# Patient Record
Sex: Male | Born: 1970 | Race: White | Hispanic: No | Marital: Single | Smoking: Current every day smoker
Health system: Southern US, Community
[De-identification: ages and names within clinical notes are randomized; demographics above are authoritative.]

## PROBLEM LIST (undated history)

## (undated) DIAGNOSIS — G629 Polyneuropathy, unspecified: Secondary | ICD-10-CM

## (undated) DIAGNOSIS — E119 Type 2 diabetes mellitus without complications: Secondary | ICD-10-CM

## (undated) DIAGNOSIS — C187 Malignant neoplasm of sigmoid colon: Secondary | ICD-10-CM

## (undated) HISTORY — PX: COLOSTOMY: SHX63

## (undated) HISTORY — DX: Malignant neoplasm of sigmoid colon: C18.7

---
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
## 2009-12-04 ENCOUNTER — Emergency Department (HOSPITAL_COMMUNITY): Admission: EM | Admit: 2009-12-04 | Discharge: 2009-12-04 | Payer: Self-pay | Admitting: Emergency Medicine

## 2011-10-05 ENCOUNTER — Encounter (HOSPITAL_COMMUNITY): Payer: Self-pay | Admitting: *Deleted

## 2011-10-05 ENCOUNTER — Emergency Department (HOSPITAL_COMMUNITY)
Admission: EM | Admit: 2011-10-05 | Discharge: 2011-10-06 | Disposition: A | Discharge status: Home / Self Care | Payer: Self-pay | Attending: Emergency Medicine | Admitting: Emergency Medicine

## 2011-10-05 DIAGNOSIS — R21 Rash and other nonspecific skin eruption: Secondary | ICD-10-CM | POA: Insufficient documentation

## 2011-10-05 DIAGNOSIS — IMO0001 Reserved for inherently not codable concepts without codable children: Secondary | ICD-10-CM | POA: Insufficient documentation

## 2011-10-05 DIAGNOSIS — M2550 Pain in unspecified joint: Secondary | ICD-10-CM | POA: Insufficient documentation

## 2011-10-05 DIAGNOSIS — W57XXXA Bitten or stung by nonvenomous insect and other nonvenomous arthropods, initial encounter: Secondary | ICD-10-CM | POA: Insufficient documentation

## 2011-10-05 DIAGNOSIS — S30860A Insect bite (nonvenomous) of lower back and pelvis, initial encounter: Secondary | ICD-10-CM | POA: Insufficient documentation

## 2011-10-05 NOTE — ED Notes (Signed)
Pt reports he removed a tick from his back 3 days ago, now area red and swollen, pt also now c/o generalized  Body aches

## 2011-10-06 MED ORDER — DOXYCYCLINE HYCLATE 100 MG PO TABS: 100.0000 mg | ORAL_TABLET | Freq: Two times a day (BID) | ORAL | Status: AC

## 2011-10-06 MED ORDER — KETOROLAC TROMETHAMINE 10 MG PO TABS
10.0000 mg | ORAL_TABLET | Freq: Once | ORAL | Status: AC
  Administered 2011-10-06: 10 mg via ORAL
  Filled 2011-10-06: qty 1

## 2011-10-06 MED ORDER — ONDANSETRON HCL 4 MG PO TABS
4.0000 mg | ORAL_TABLET | Freq: Once | ORAL | Status: AC
  Administered 2011-10-06: 4 mg via ORAL
  Filled 2011-10-06: qty 1

## 2011-10-06 MED ORDER — DOXYCYCLINE HYCLATE 100 MG PO TABS
100.0000 mg | ORAL_TABLET | Freq: Once | ORAL | Status: AC
  Administered 2011-10-06: 100 mg via ORAL
  Filled 2011-10-06: qty 1

## 2011-10-06 NOTE — ED Notes (Addendum)
Pt reporting tick bite on Thursday night. Area on back, approximately size of a quarter.  States that since that time, he has had progressive muscle aches in lower back and arms.  Pt also reporting rash on lower legs.  States rash began today.  Small raised rash noted.  Pt showing no signs of distress at this time.

## 2011-10-06 NOTE — ED Provider Notes (Signed)
History     CSN: [id number]  Arrival date & time 10/05/11  2347   First MD Initiated Contact with Patient 10/06/11 0004      Chief Complaint  Patient presents with  . Wound Check    (Consider location/radiation/quality/duration/timing/severity/associated sxs/prior treatment) HPI Comments: Pt was out fishing when he ran into a night swarm of mosquitoes and was bit by a tick. 2 days later he noted a rash on the right leg and a sore raised area of the lower back. He removed a tick. No c/o fever. He c/o stiffness of the hands and shoulders. It is of note he has be fishing for "monster fish" that weigh an average of 40 lbs. He tried tylenol, but this has not help. Pt concerned about RMSF.   History reviewed. No pertinent past medical history.  History reviewed. No pertinent past surgical history.  No family history on file.  History  Substance Use Topics  . Smoking status: Current Everyday Smoker  . Smokeless tobacco: Not on file  . Alcohol Use: Yes      Review of Systems  Constitutional: Negative for activity change.       All ROS Neg except as noted in HPI  HENT: Negative for nosebleeds and neck pain.   Eyes: Negative for photophobia and discharge.  Respiratory: Negative for cough, shortness of breath and wheezing.   Cardiovascular: Negative for chest pain and palpitations.  Gastrointestinal: Negative for abdominal pain and blood in stool.  Genitourinary: Negative for dysuria, frequency and hematuria.  Musculoskeletal: Positive for myalgias and arthralgias. Negative for back pain.  Skin: Positive for rash.  Neurological: Negative for dizziness, seizures and speech difficulty.  Psychiatric/Behavioral: Negative for hallucinations and confusion.    Allergies  Review of patient's allergies indicates no known allergies.  Home Medications  No current outpatient prescriptions on file.  BP 145/82  Pulse 88  Temp(Src) 98 F (36.7 C) (Oral)  Resp 20  Ht 5\' 8"  (1.727 m)   Wt 290 lb (131.543 kg)  BMI 44.09 kg/m2  SpO2 98%  Physical Exam  Nursing note and vitals reviewed. Constitutional: He is oriented to person, place, and time. He appears well-developed and well-nourished.  Non-toxic appearance.  HENT:  Head: Normocephalic.  Right Ear: Tympanic membrane and external ear normal.  Left Ear: Tympanic membrane and external ear normal.  Eyes: EOM and lids are normal. Pupils are equal, round, and reactive to light.  Neck: Normal range of motion. Neck supple. Carotid bruit is not present.  Cardiovascular: Normal rate, regular rhythm, normal heart sounds, intact distal pulses and normal pulses.   Pulmonary/Chest: Breath sounds normal. No respiratory distress.  Abdominal: Soft. Bowel sounds are normal. There is no tenderness. There is no guarding.  Musculoskeletal:       Raised small red area at the site of a tick bite of the lower back. Pt c/o stiffness of the fingers and shoulders. Bite site examined with magnification. No insect body parts noted.  Lymphadenopathy:       Head (right side): No submandibular adenopathy present.       Head (left side): No submandibular adenopathy present.    He has no cervical adenopathy.  Neurological: He is alert and oriented to person, place, and time. He has normal strength. No cranial nerve deficit or sensory deficit.  Skin: Skin is warm and dry.       Pt has a papular rash of the right lower ext.   Psychiatric: He has a normal mood  and affect. His speech is normal.    ED Course  Procedures (including critical care time)  Labs Reviewed - No data to display No results found.   No diagnosis found.    MDM  I have reviewed nursing notes, vital signs, and all appropriate lab and imaging results for this patient. Rx for doxycycline give. Pt encouraged to use ibuprofen three times daily with food       Kathie Dike, [place] 10/06/11 0033  Kathie Dike, [place] 10/06/11 [phone]

## 2011-10-06 NOTE — Discharge Instructions (Signed)
Wood Tick Bite Ticks are insects that attach themselves to the skin. Most tick bites are harmless, but sometimes ticks carry diseases that can make a person quite ill. The chance of getting ill depends on:  The kind of tick that bites you.   Time of year.   How long the tick is attached.   Geographic location.  Wood ticks are also called dog ticks. They are generally black. They can have white markings. They live in shrubs and grassy areas. They are larger than deer ticks. Wood ticks are about the size of a watermelon seed. They have a hard body. The most common places for ticks to attach themselves are the scalp, neck, armpits, waist, and groin. Wood ticks may stay attached for up to 2 weeks. TICKS MUST BE REMOVED AS SOON AS POSSIBLE TO HELP PREVENT DISEASES CAUSED BY TICK BITES.  TO REMOVE A TICK: 1. If available, put on latex gloves before trying to remove a tick.  2. Grasp the tick as close to the skin as possible, with curved forceps, fine tweezers or a special tick removal tool.  3. Pull gently with steady pressure until the tick lets go. Do not twist the tick or jerk it suddenly. This may break off the tick's head or mouth parts.  4. Do not crush the tick's body. This could force disease-carrying fluids from the tick into your body.  5. After the tick is removed, wash the bite area and your hands with soap and water or other disinfectant.  6. Apply a small amount of antiseptic cream or ointment to the bite site.  7. Wash and disinfect any instruments that were used.  8. Save the tick in a jar or plastic bag for later identification. Preserve the tick with a bit of alcohol or put it in the freezer.  9. Do not apply a hot match, petroleum jelly, or fingernail polish to the tick. This does not work and may increase the chances of disease from the tick bite.  YOU MAY NEED TO SEE YOUR CAREGIVER FOR A TETANUS SHOT NOW IF:  You have no idea when you had the last one.   You have never had a  tetanus shot before.  If you need a tetanus shot, and you decide not to get one, there is a rare chance of getting tetanus. Sickness from tetanus can be serious. If you get a tetanus shot, your arm may swell, get red and warm to the touch at the shot site. This is common and not a problem. PREVENTION  Wear protective clothing. Long sleeves and pants are best.   Wear white clothes to see ticks more easily   Tuck your pant legs into your socks.   If walking on trail, stay in the middle of the trail to avoid brushing against bushes.   Put insect repellent on all exposed skin and along boot tops, pant legs and sleeve cuffs   Check clothing, hair and skin repeatedly and before coming inside.   Brush off any ticks that are not attached.  SEEK MEDICAL CARE IF:   You cannot remove a tick or part of the tick that is left in the skin.   Unexplained fever.   Redness and swelling in the area of the tick bite.   Tender, swollen lymph glands.   Diarrhea.   Weight loss.   Cough.   Fatigue.   Muscle, joint or bone pain.   Belly pain.   Headache.   Rash.    SEEK IMMEDIATE MEDICAL CARE IF:   You develop an oral temperature above 102 F (38.9 C).   You are having trouble walking or moving your legs.   Numbness in the legs.   Shortness of breath.   Confusion.   Repeated vomiting.  Document Released: 05/02/2000 Document Revised: 04/24/2011 Document Reviewed: 04/10/2008 [employer] Patient Information 2012 [employer]. 

## 2011-10-07 NOTE — ED Provider Notes (Signed)
Medical screening examination/treatment/procedure(s) were performed by non-physician practitioner and as supervising physician I was immediately available for consultation/collaboration.  Nicoletta Dress. Colon Branch, MD 10/07/11 [phone]

## 2021-01-01 DIAGNOSIS — K5792 Diverticulitis of intestine, part unspecified, without perforation or abscess without bleeding: Secondary | ICD-10-CM | POA: Insufficient documentation

## 2021-01-13 ENCOUNTER — Encounter (HOSPITAL_COMMUNITY): Payer: Self-pay

## 2021-01-13 ENCOUNTER — Emergency Department (HOSPITAL_COMMUNITY)
Admission: EM | Admit: 2021-01-13 | Discharge: 2021-01-13 | Disposition: A | Discharge status: Home / Self Care | Payer: Commercial Managed Care - PPO | Attending: Emergency Medicine | Admitting: Emergency Medicine

## 2021-01-13 ENCOUNTER — Emergency Department (HOSPITAL_COMMUNITY)
Admission: EM | Admit: 2021-01-13 | Discharge: 2021-01-13 | Payer: Commercial Managed Care - PPO | Attending: Emergency Medicine | Admitting: Emergency Medicine

## 2021-01-13 ENCOUNTER — Emergency Department (HOSPITAL_COMMUNITY): Payer: Commercial Managed Care - PPO

## 2021-01-13 ENCOUNTER — Other Ambulatory Visit: Payer: Self-pay

## 2021-01-13 DIAGNOSIS — A419 Sepsis, unspecified organism: Secondary | ICD-10-CM | POA: Insufficient documentation

## 2021-01-13 DIAGNOSIS — M79652 Pain in left thigh: Secondary | ICD-10-CM | POA: Diagnosis present

## 2021-01-13 DIAGNOSIS — F17200 Nicotine dependence, unspecified, uncomplicated: Secondary | ICD-10-CM | POA: Insufficient documentation

## 2021-01-13 DIAGNOSIS — Z5321 Procedure and treatment not carried out due to patient leaving prior to being seen by health care provider: Secondary | ICD-10-CM | POA: Insufficient documentation

## 2021-01-13 DIAGNOSIS — L02416 Cutaneous abscess of left lower limb: Secondary | ICD-10-CM | POA: Insufficient documentation

## 2021-01-13 DIAGNOSIS — L0291 Cutaneous abscess, unspecified: Secondary | ICD-10-CM

## 2021-01-13 LAB — CBC WITH DIFFERENTIAL/PLATELET
Abs Immature Granulocytes: 0.35 10*3/uL — ABNORMAL HIGH (ref 0.00–0.07)
Basophils Absolute: 0.1 10*3/uL (ref 0.0–0.1)
Basophils Relative: 0 %
Eosinophils Absolute: 0.2 10*3/uL (ref 0.0–0.5)
Eosinophils Relative: 1 %
HCT: 31.2 % — ABNORMAL LOW (ref 39.0–52.0)
Hemoglobin: 9.5 g/dL — ABNORMAL LOW (ref 13.0–17.0)
Immature Granulocytes: 2 %
Lymphocytes Relative: 12 %
Lymphs Abs: 2.8 10*3/uL (ref 0.7–4.0)
MCH: 24.5 pg — ABNORMAL LOW (ref 26.0–34.0)
MCHC: 30.4 g/dL (ref 30.0–36.0)
MCV: 80.4 fL (ref 80.0–100.0)
Monocytes Absolute: 1.3 10*3/uL — ABNORMAL HIGH (ref 0.1–1.0)
Monocytes Relative: 6 %
Neutro Abs: 19 10*3/uL — ABNORMAL HIGH (ref 1.7–7.7)
Neutrophils Relative %: 79 %
Platelets: 739 10*3/uL — ABNORMAL HIGH (ref 150–400)
RBC: 3.88 MIL/uL — ABNORMAL LOW (ref 4.22–5.81)
RDW: 17.6 % — ABNORMAL HIGH (ref 11.5–15.5)
WBC: 23.6 10*3/uL — ABNORMAL HIGH (ref 4.0–10.5)
nRBC: 0 % (ref 0.0–0.2)

## 2021-01-13 LAB — BASIC METABOLIC PANEL
Anion gap: 10 (ref 5–15)
BUN: 10 mg/dL (ref 6–20)
CO2: 30 mmol/L (ref 22–32)
Calcium: 9.1 mg/dL (ref 8.9–10.3)
Chloride: 91 mmol/L — ABNORMAL LOW (ref 98–111)
Creatinine, Ser: 0.55 mg/dL — ABNORMAL LOW (ref 0.61–1.24)
GFR, Estimated: >60 mL/min (ref >60)
Glucose, Bld: 122 mg/dL — ABNORMAL HIGH (ref 70–99)
Potassium: 4.7 mmol/L (ref 3.5–5.1)
Sodium: 131 mmol/L — ABNORMAL LOW (ref 135–145)

## 2021-01-13 LAB — COMPREHENSIVE METABOLIC PANEL
ALT: 27 U/L (ref 0–44)
AST: 32 U/L (ref 15–41)
Albumin: 2.3 g/dL — ABNORMAL LOW (ref 3.5–5.0)
Alkaline Phosphatase: 160 U/L — ABNORMAL HIGH (ref 38–126)
Anion gap: 9 (ref 5–15)
BUN: 9 mg/dL (ref 6–20)
CO2: 28 mmol/L (ref 22–32)
Calcium: 8.7 mg/dL — ABNORMAL LOW (ref 8.9–10.3)
Chloride: 93 mmol/L — ABNORMAL LOW (ref 98–111)
Creatinine, Ser: 0.61 mg/dL (ref 0.61–1.24)
GFR, Estimated: >60 mL/min (ref >60)
Glucose, Bld: 200 mg/dL — ABNORMAL HIGH (ref 70–99)
Potassium: 4.2 mmol/L (ref 3.5–5.1)
Sodium: 130 mmol/L — ABNORMAL LOW (ref 135–145)
Total Bilirubin: 0.5 mg/dL (ref 0.3–1.2)
Total Protein: 6 g/dL — ABNORMAL LOW (ref 6.5–8.1)

## 2021-01-13 LAB — BLOOD GAS, VENOUS
Acid-Base Excess: 4.8 mmol/L — ABNORMAL HIGH (ref 0.0–2.0)
Bicarbonate: 28.4 mmol/L — ABNORMAL HIGH (ref 20.0–28.0)
FIO2: 21
O2 Saturation: 75.5 %
Patient temperature: 37.7
pCO2, Ven: 43.3 mmHg — ABNORMAL LOW (ref 44.0–60.0)
pH, Ven: 7.441 — ABNORMAL HIGH (ref 7.250–7.430)
pO2, Ven: 44.4 mmHg (ref 32.0–45.0)

## 2021-01-13 LAB — LACTIC ACID, PLASMA: Lactic Acid, Venous: 0.8 mmol/L (ref 0.5–1.9)

## 2021-01-13 LAB — CBC
HCT: 29.7 % — ABNORMAL LOW (ref 39.0–52.0)
Hemoglobin: 9.2 g/dL — ABNORMAL LOW (ref 13.0–17.0)
MCH: 24.5 pg — ABNORMAL LOW (ref 26.0–34.0)
MCHC: 31 g/dL (ref 30.0–36.0)
MCV: 79.2 fL — ABNORMAL LOW (ref 80.0–100.0)
Platelets: 703 10*3/uL — ABNORMAL HIGH (ref 150–400)
RBC: 3.75 MIL/uL — ABNORMAL LOW (ref 4.22–5.81)
RDW: 17.7 % — ABNORMAL HIGH (ref 11.5–15.5)
WBC: 22.2 10*3/uL — ABNORMAL HIGH (ref 4.0–10.5)
nRBC: 0 % (ref 0.0–0.2)

## 2021-01-13 MED ORDER — IOHEXOL 350 MG/ML SOLN
100.0000 mL | Freq: Once | INTRAVENOUS | Status: AC | PRN
  Administered 2021-01-13: 75 mL via INTRAVENOUS

## 2021-01-13 MED ORDER — VANCOMYCIN HCL 1250 MG/250ML IV SOLN: 1250.0000 mg | Freq: Three times a day (TID) | INTRAVENOUS | Status: DC

## 2021-01-13 MED ORDER — SODIUM CHLORIDE 0.9 % IV BOLUS
1000.0000 mL | Freq: Once | INTRAVENOUS | Status: AC
  Administered 2021-01-13: 1000 mL via INTRAVENOUS

## 2021-01-13 MED ORDER — HYDROCODONE-ACETAMINOPHEN 5-325 MG PO TABS
1.0000 | ORAL_TABLET | Freq: Once | ORAL | Status: AC
  Administered 2021-01-13: 1 via ORAL
  Filled 2021-01-13: qty 1

## 2021-01-13 MED ORDER — VANCOMYCIN HCL 2000 MG/400ML IV SOLN
2000.0000 mg | Freq: Once | INTRAVENOUS | Status: AC
  Administered 2021-01-13: 2000 mg via INTRAVENOUS
  Filled 2021-01-13: qty 400

## 2021-01-13 MED ORDER — PIPERACILLIN-TAZOBACTAM 3.375 G IVPB 30 MIN
3.3750 g | Freq: Once | INTRAVENOUS | Status: AC
  Administered 2021-01-13: 3.375 g via INTRAVENOUS
  Filled 2021-01-13: qty 50

## 2021-01-13 NOTE — Progress Notes (Signed)
Pharmacy Antibiotic Note  Jose Melendez a 50 y.o. male admitted on 01/13/2021 with  thigh abscess .  Pharmacy has been consulted for vancomycin dosing.  Plan: Vancomycin '1250mg'$  IV every 8 hours.  Goal trough 15-20 mcg/mL.  Medical History: History reviewed. No pertinent past medical history.  Allergies:  No Known Allergies  Filed Weights   01/13/21 1110  Weight: 121.6 kg (268 lb)    CBC Latest Ref Rng & Units 01/13/2021 01/13/2021  WBC 4.0 - 10.5 K/uL 23.6(H) 22.2(H)  Hemoglobin 13.0 - 17.0 g/dL 9.5(L) 9.2(L)  Hematocrit 39.0 - 52.0 % 31.2(L) 29.7(L)  Platelets 150 - 400 K/uL 739(H) 703(H)     Estimated Creatinine Clearance: 142.6 mL/min (A) (by C-G formula based on SCr of 0.55 mg/dL (L)).  Antibiotics Given (last 72 hours)     None       Antimicrobials this admission:   vancomycin 01/13/2021 >>   Microbiology results: 01/13/2021 BCx: sent   Thank you for allowing pharmacy to be a part of this patient's care.  Thomasenia Sales, PharmD Clinical Pharmacist

## 2021-01-13 NOTE — ED Notes (Signed)
Colotomy intact with brown soft stool noted.  Incision to mid abdomen with staples are dry and intact.  No redness or drainage noted.

## 2021-01-13 NOTE — ED Notes (Signed)
Pt decided to leave 

## 2021-01-13 NOTE — ED Provider Notes (Addendum)
[facility] Provider Note   CSN: [id number] Arrival date & time: 01/13/21  1025     History No chief complaint on file.   Jose Melendez is a 50 y.o. male.  Patient is a 50 yo male presenting for leg pain. 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 Aldona Lento on 8/17 at [facility]. States since discharge he has had progress left proximal anterior leg pain, non radiating, associated with swelling, stiffness, and inability to sleep do to discomfort. Denies fevers or chills.   The history is provided by the patient. No language interpreter was used.  Leg Pain Location:  Leg Leg location:  L leg Pain details:    Quality:  Aching   Radiates to:  Does not radiate Associated symptoms: no back pain and no fever       History reviewed. No pertinent past medical history.  There are no problems to display for this patient.   No past surgical history on file.     No family history on file.  Social History   Tobacco Use   Smoking status: Every Day  Substance Use Topics   Alcohol use: Yes    Home Medications Prior to Admission medications   Not on File    Allergies    Patient has no known allergies.  Review of Systems   Review of Systems  Constitutional:  Negative for chills and fever.  HENT:  Negative for ear pain and sore throat.   Eyes:  Negative for pain and visual disturbance.  Respiratory:  Negative for cough and shortness of breath.   Cardiovascular:  Negative for chest pain and palpitations.  Gastrointestinal:  Negative for abdominal pain and vomiting.  Genitourinary:  Negative for dysuria and hematuria.  Musculoskeletal:  Negative for arthralgias and back pain.       Left leg pain  Skin:  Negative for color change and rash.  Neurological:  Negative for seizures and syncope.  All other systems reviewed and are  negative.  Physical Exam Updated Vital Signs BP 120/76   Pulse 100   Temp 98.8 F (37.1 C) (Oral)   Resp 16   Ht '5\' 7"'$  (1.702 m)   Wt 121.6 kg   SpO2 99%   BMI 41.97 kg/m   Physical Exam Vitals and nursing note reviewed.  Constitutional:      Appearance: He is well-developed.  HENT:     Head: Normocephalic and atraumatic.  Eyes:     Conjunctiva/sclera: Conjunctivae normal.  Cardiovascular:     Rate and Rhythm: Normal rate and regular rhythm.     Heart sounds: No murmur heard. Pulmonary:     Effort: Pulmonary effort is normal. No respiratory distress.     Breath sounds: Normal breath sounds.  Abdominal:     Palpations: Abdomen is soft.     Tenderness: There is no abdominal tenderness.  Musculoskeletal:     Cervical back: Neck supple.       Legs:  Skin:    General: Skin is warm and dry.  Neurological:     Mental Status: He is alert.      ED Results / Procedures / Treatments   Labs (all labs ordered are listed, but only abnormal results are displayed) Labs Reviewed  CBC WITH DIFFERENTIAL/PLATELET - Abnormal; Notable for the following components:      Result Value  WBC 23.6 (*)    RBC 3.88 (*)    Hemoglobin 9.5 (*)    HCT 31.2 (*)    MCH 24.5 (*)    RDW 17.6 (*)    Platelets 739 (*)    Neutro Abs 19.0 (*)    Monocytes Absolute 1.3 (*)    Abs Immature Granulocytes 0.35 (*)    All other components within normal limits  BASIC METABOLIC PANEL - Abnormal; Notable for the following components:   Sodium 131 (*)    Chloride 91 (*)    Glucose, Bld 122 (*)    Creatinine, Ser 0.55 (*)    All other components within normal limits  CULTURE, BLOOD (ROUTINE X 2)  CULTURE, BLOOD (ROUTINE X 2)  LACTIC ACID, PLASMA  LACTIC ACID, PLASMA  BLOOD GAS, VENOUS    EKG None  Radiology CT EXTREMITY LOWER LEFT W CONTRAST  Result Date: 01/13/2021 CLINICAL DATA:  Worsening left thigh pain. Recent perforated sigmoid diverticulitis complicated by pelvic abscess extending  into the left leg. EXAM: CT OF THE LOWER LEFT EXTREMITY WITH CONTRAST TECHNIQUE: Multidetector CT imaging of the lower left extremity was performed according to the standard protocol following intravenous contrast administration. CONTRAST:  23m OMNIPAQUE IOHEXOL 350 MG/ML SOLN COMPARISON:  MRI left hip and femur dated January 01, 2021. FINDINGS: Bones/Joint/Cartilage No fracture or dislocation. Mild left hip osteoarthritis. No joint effusion. Ligaments Ligaments are suboptimally evaluated by CT. Muscles and Tendons Interval enlargement of the multiloculated thick-walled, rim enhancing fluid collection extending from the left iliopsoas muscle to the mid thigh within the anterior muscle compartment. This measures up to 6.3 x 7.9 x 30.2 cm (series 5, image 88; series 8, image 36). Soft tissue Moderate lateral hip soft tissue swelling. Mild diffuse soft tissue swelling of the left thigh. Reactive left inguinal lymphadenopathy. No soft tissue mass. Interval colectomy with Hartmann's pouch. IMPRESSION: 1. Interval enlargement of the multiloculated abscess extending from the left iliopsoas muscle to the mid thigh within the anterior muscle compartment, measuring over 30 cm in length. 2. No acute osseous abnormality. Electronically Signed   By: WTitus DubinM.D.   On: 01/13/2021 15:27    Procedures .Critical Care  Date/Time: 01/13/2021 4:00 PM Performed by: GLianne Cure DO Authorized by: GLianne Cure DO   Critical care provider statement:    Critical care time (minutes):  42   Critical care was necessary to treat or prevent imminent or life-threatening deterioration of the following conditions: loculated abscess in iliopsoas with sepsis.   Critical care was time spent personally by me on the following activities:  Ordering and performing treatments and interventions, ordering and review of laboratory studies, ordering and review of radiographic studies, re-evaluation of patient's condition, review of old  charts, examination of patient, evaluation of patient's response to treatment and discussions with consultants   I assumed direction of critical care for this patient from another provider in my specialty: yes     Care [facility] with comment[facility]     [facility] for continuity of care   Medications Ordered in ED Medications  HYDROcodone-acetaminophen (NORCO/VICODIN) 5-325 MG per tablet 1 tablet (1 tablet Oral Given 01/13/21 1320)  iohexol (OMNIPAQUE) 350 MG/ML injection 100 mL (75 mLs Intravenous Contrast Given 01/13/21 1451)  sodium chloride 0.9 % bolus 1,000 mL (1,000 mLs Intravenous New Bag/Given 01/13/21 1549)    ED Course  I have reviewed the triage vital signs and  the nursing notes.  Pertinent labs & imaging results that were available during my care of the patient were reviewed by me and considered in my medical decision making (see chart for details).  Clinical Course as of 01/14/21 1314  Sun Jan 13, 2021  1624 5c bed 3 5th floor [place] [phone] [LJ]    Clinical Course User Index [LJ] Rayna Sexton, PA-C   MDM Rules/Calculators/A&P                           1:13 PM  50 yo male presenting for leg pain. 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 Aldona Lento on 8/17.  Physical exam demonstrates well approximated vertical incision on left anterior proximal thigh. No swelling, wound dehiscence, erythema, or drainage. Tenderness to palpation proximal and lateral to incision with induration.  Signs/Symptoms of sepsis. Blood clutures, VBG, and LA ordered. IV fluids and vancomycin started for recurrent abscess post op day 11 with s/s sepsis.   CT lower extremity demonstrates: 1. Interval enlargement of the multiloculated abscess extending from the left iliopsoas muscle to the mid  thigh within the anterior muscle compartment, measuring over 30 cm in length. 2. No acute osseous abnormality.  I spoke with general surgeon on call at [facility] Dr. Christian Mate who agrees that patient would be best for transfer for continuity of care. I spoke with general surgeon Dr. Marissa Calamity at [facility] who agrees to accept patient to [facility] for admit.     Final Clinical Impression(s) / ED Diagnoses Final diagnoses:  Abscess thigh  Sepsis, due to unspecified organism, unspecified whether acute organ dysfunction present [facility])    Rx / DC Orders ED Discharge Orders     None        Lianne Cure, DO [id number]    Lianne Cure, DO [id number]    Lianne Cure, DO [id number]

## 2021-01-13 NOTE — ED Notes (Signed)
Refused 2nd blood culture.

## 2021-01-13 NOTE — ED Notes (Signed)
Dr Pearline Cables aware that pt refused 2nd BC.

## 2021-01-13 NOTE — ED Triage Notes (Signed)
Pt has pain in his leg, left leg pain.  Pt had sx on 2 weeks ago for stomach pain and now has pain in his leg.

## 2021-01-13 NOTE — ED Triage Notes (Signed)
Patient had recent abdominal surgery for colon problem with colostomy. Patient now complains of left thigh pain, denies trauma. Pain worse with ambulation

## 2021-01-18 LAB — CULTURE, BLOOD (ROUTINE X 2)
Culture: NO GROWTH
Special Requests: ADEQUATE

## 2021-01-30 DIAGNOSIS — C187 Malignant neoplasm of sigmoid colon: Secondary | ICD-10-CM | POA: Insufficient documentation

## 2021-04-03 DIAGNOSIS — Z95828 Presence of other vascular implants and grafts: Secondary | ICD-10-CM | POA: Insufficient documentation

## 2021-04-23 DIAGNOSIS — C801 Malignant (primary) neoplasm, unspecified: Secondary | ICD-10-CM | POA: Insufficient documentation

## 2022-02-28 ENCOUNTER — Telehealth (HOSPITAL_COMMUNITY): Payer: Self-pay

## 2022-02-28 NOTE — Telephone Encounter (Signed)
Called pt to schedule appt with Dr Nehemiah Settle no answer vm full

## 2022-03-23 IMAGING — CT CT EXTREM LOW W/ CM*L*
3 series · 9 of 33 positions shown, 10 images · IV contrast (omnipaque)
Comparison: MRI left hip and femur dated January 01, 2021.

CLINICAL DATA: Worsening left thigh pain. Recent perforated sigmoid
diverticulitis complicated by pelvic abscess extending into the left
leg.

EXAM:
CT OF THE LOWER LEFT EXTREMITY WITH CONTRAST
TECHNIQUE: Multidetector CT imaging of the lower left extremity was performed
according to the standard protocol following intravenous contrast
administration.
CONTRAST:  75mL OMNIPAQUE IOHEXOL 350 MG/ML SOLN

[Series 5: axial st · axial · 0.44mm/px · z∈[+830,+830]mm · 1 of 281 slices shown, 2 images]
[im 151/281  soft-tissue]
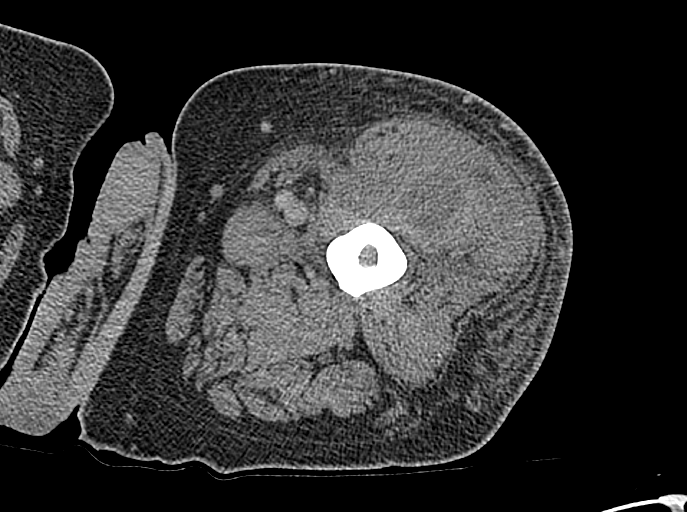
[im 151/281  bone]
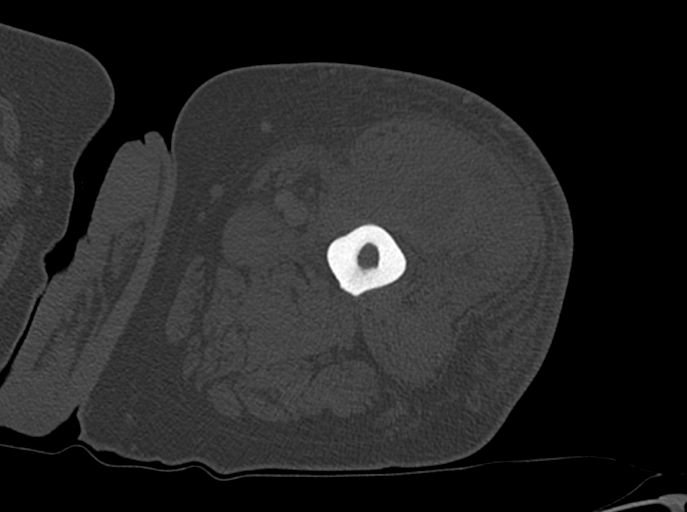

[Series 8: cor st · coronal · 0.50mm/px · 3 of 121 slices shown]
[im 25/121  bone]
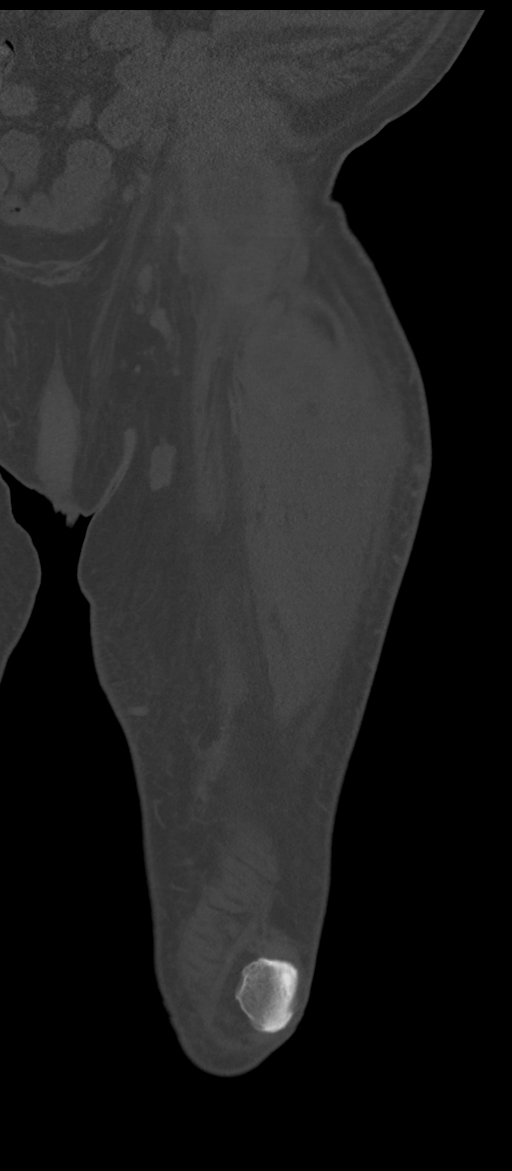
[im 49/121  bone]
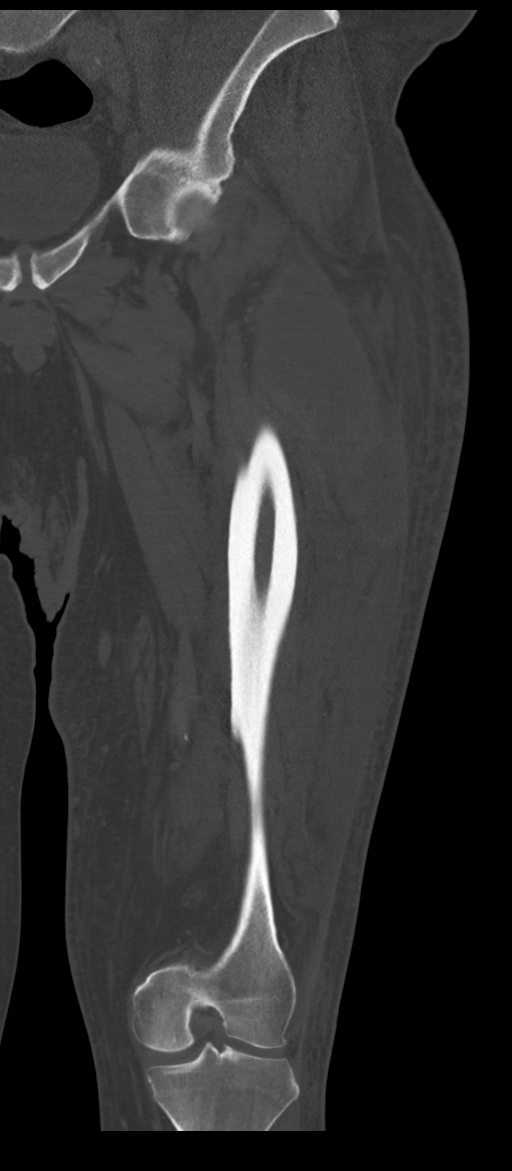
[im 73/121  bone]
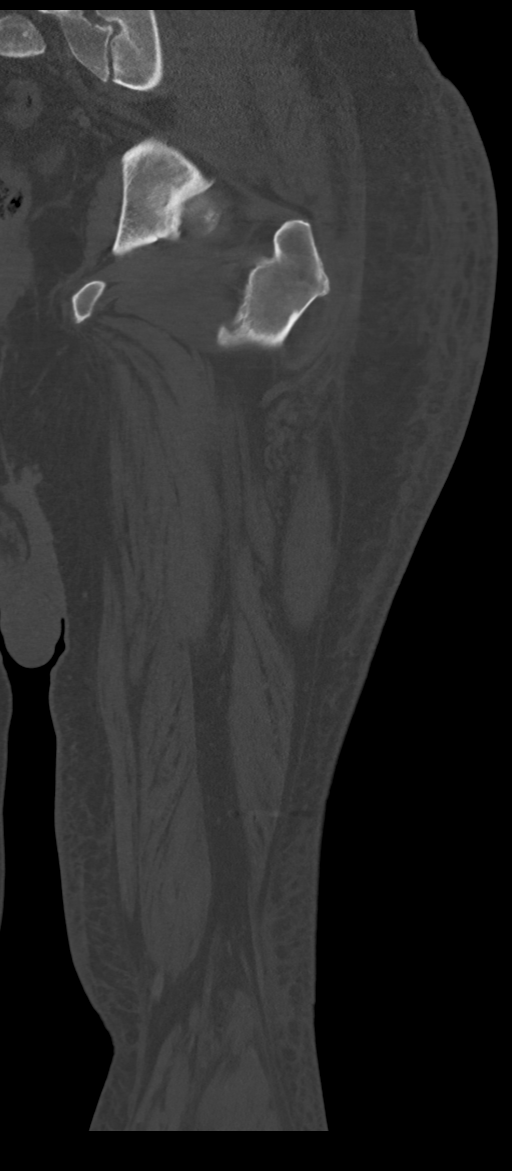

[Series 9: sag st · sagittal · 0.53mm/px · 5 of 124 slices shown]
[im 42/124  bone]
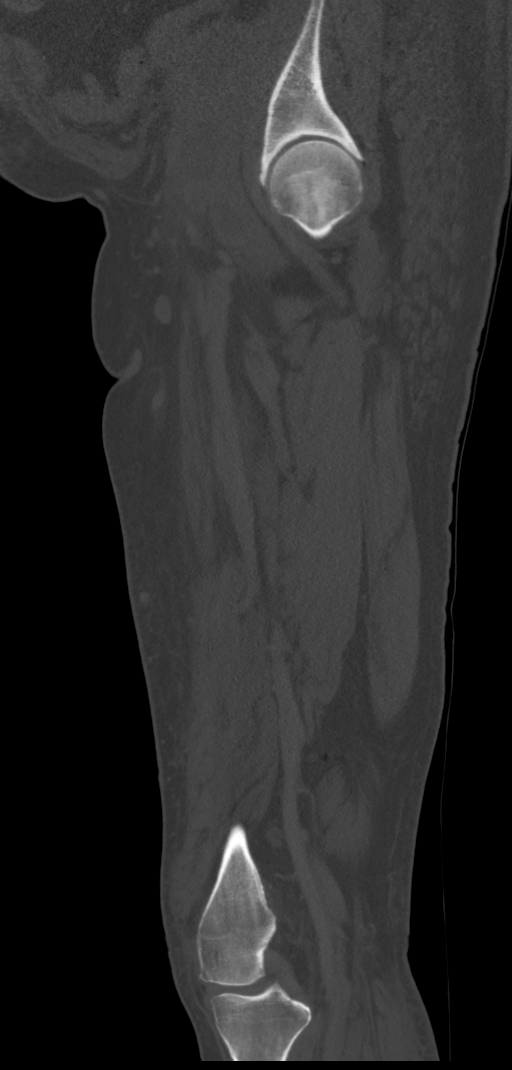
[im 52/124  bone]
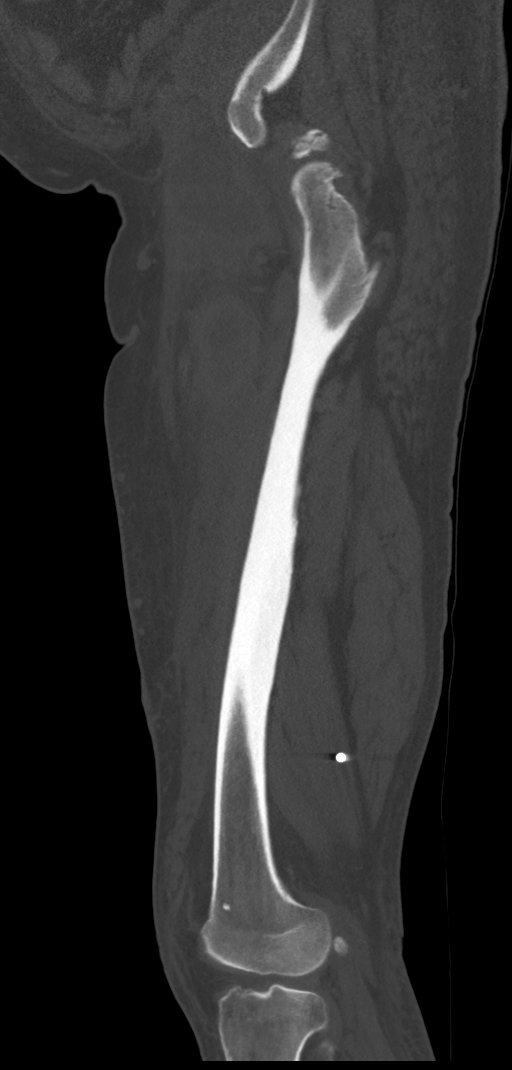
[im 62/124  bone]
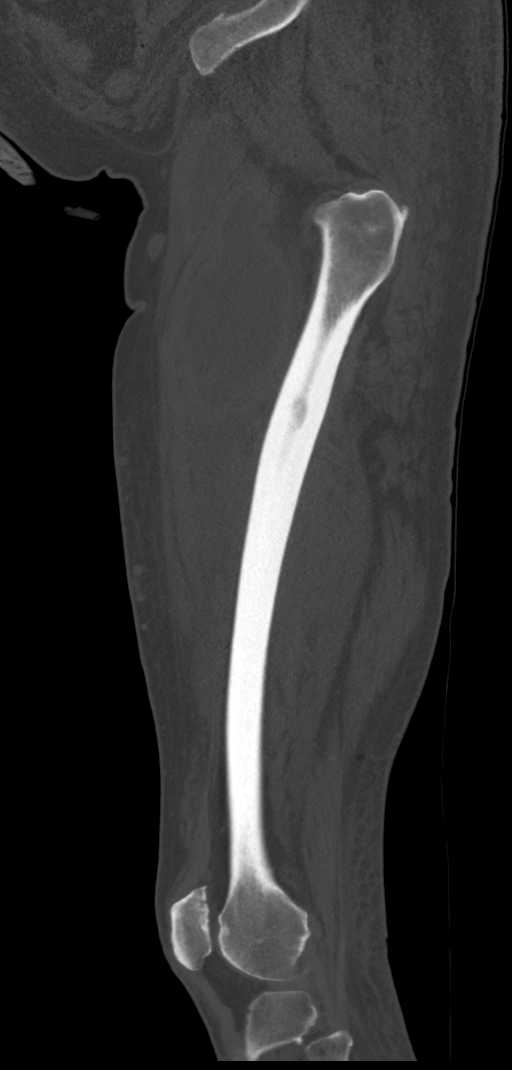
[im 72/124  bone]
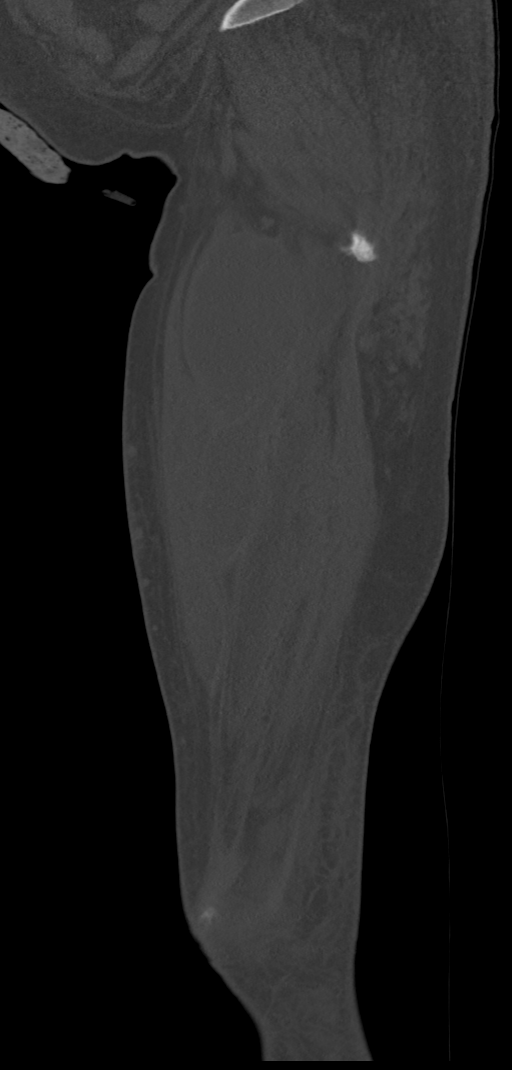
[im 83/124  bone]
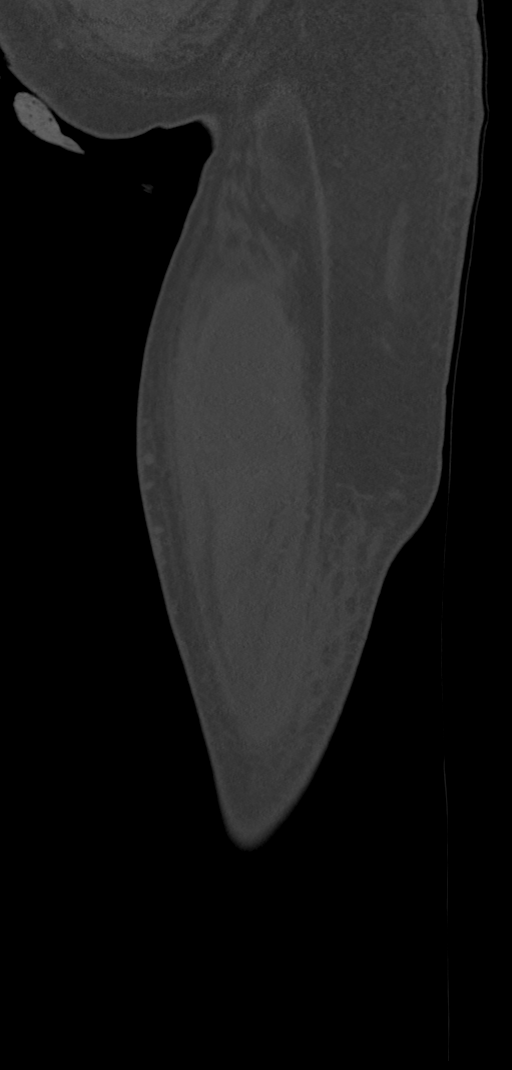

[9 of 33 positions shown; findings below may reference images not displayed]

FINDINGS: Bones/Joint/Cartilage

No fracture or dislocation. Mild left hip osteoarthritis. No joint
effusion.

Ligaments

Ligaments are suboptimally evaluated by CT.

Muscles and Tendons
Interval enlargement of the multiloculated thick-walled, rim
enhancing fluid collection extending from the left iliopsoas muscle
to the mid thigh within the anterior muscle compartment. This
measures up to 6.3 x 7.9 x 30.2 cm (series 5, image 88; series 8,
image 36).

Soft tissue
Moderate lateral hip soft tissue swelling. Mild diffuse soft tissue
swelling of the left thigh. Reactive left inguinal lymphadenopathy.
No soft tissue mass.

Interval colectomy with Hartmann's pouch.
IMPRESSION: 1. Interval enlargement of the multiloculated abscess extending from
the left iliopsoas muscle to the mid thigh within the anterior
muscle compartment, measuring over 30 cm in length.
2. No acute osseous abnormality.

## 2022-06-03 ENCOUNTER — Inpatient Hospital Stay: Payer: 59 | Attending: Hematology | Admitting: Hematology

## 2022-06-03 ENCOUNTER — Encounter: Payer: Self-pay | Admitting: Hematology

## 2022-06-03 ENCOUNTER — Other Ambulatory Visit: Payer: Self-pay

## 2022-06-03 ENCOUNTER — Inpatient Hospital Stay: Payer: 59

## 2022-06-03 VITALS — BP 144/70 | HR 88 | Temp 97.8°F | Resp 18 | Ht 68.0 in | Wt 328.4 lb

## 2022-06-03 DIAGNOSIS — R918 Other nonspecific abnormal finding of lung field: Secondary | ICD-10-CM | POA: Insufficient documentation

## 2022-06-03 DIAGNOSIS — Z79899 Other long term (current) drug therapy: Secondary | ICD-10-CM | POA: Diagnosis not present

## 2022-06-03 DIAGNOSIS — T451X5A Adverse effect of antineoplastic and immunosuppressive drugs, initial encounter: Secondary | ICD-10-CM | POA: Diagnosis not present

## 2022-06-03 DIAGNOSIS — Z95828 Presence of other vascular implants and grafts: Secondary | ICD-10-CM

## 2022-06-03 DIAGNOSIS — C187 Malignant neoplasm of sigmoid colon: Secondary | ICD-10-CM | POA: Insufficient documentation

## 2022-06-03 DIAGNOSIS — Z452 Encounter for adjustment and management of vascular access device: Secondary | ICD-10-CM | POA: Insufficient documentation

## 2022-06-03 DIAGNOSIS — F17210 Nicotine dependence, cigarettes, uncomplicated: Secondary | ICD-10-CM | POA: Diagnosis not present

## 2022-06-03 DIAGNOSIS — G620 Drug-induced polyneuropathy: Secondary | ICD-10-CM | POA: Insufficient documentation

## 2022-06-03 DIAGNOSIS — R69 Illness, unspecified: Secondary | ICD-10-CM | POA: Diagnosis not present

## 2022-06-03 DIAGNOSIS — F32A Depression, unspecified: Secondary | ICD-10-CM | POA: Diagnosis not present

## 2022-06-03 MED ORDER — OXYCODONE-ACETAMINOPHEN 7.5-325 MG PO TABS: 1.0000 | ORAL_TABLET | Freq: Three times a day (TID) | ORAL | 0 refills | Status: DC | PRN

## 2022-06-03 MED ORDER — HEPARIN SOD (PORK) LOCK FLUSH 100 UNIT/ML IV SOLN
500.0000 [IU] | Freq: Once | INTRAVENOUS | Status: AC
  Administered 2022-06-03: 500 [IU] via INTRAVENOUS

## 2022-06-03 MED ORDER — HEPARIN SOD (PORK) LOCK FLUSH 100 UNIT/ML IV SOLN: 500.0000 [IU] | Freq: Once | INTRAVENOUS | Status: AC

## 2022-06-03 MED ORDER — DULOXETINE HCL 30 MG PO CPEP: 60.0000 mg | ORAL_CAPSULE | Freq: Every day | ORAL | 3 refills | Status: DC

## 2022-06-03 MED ORDER — SODIUM CHLORIDE 0.9% FLUSH
10.0000 mL | Freq: Once | INTRAVENOUS | Status: AC
  Administered 2022-06-03: 10 mL via INTRAVENOUS

## 2022-06-03 NOTE — Patient Instructions (Addendum)
Jose Melendez  Discharge Instructions  You were seen and examined today by Dr. Delton Coombes. Dr. Delton Coombes is a medical oncologist, meaning that he specializes in the treatment of cancer diagnoses. Dr. Delton Coombes discussed your past medical history, family history of cancers, and the events that led to you being here today.  You were referred to Dr. Delton Coombes for ongoing care of your colon cancer.  Dr. Delton Coombes has recommended continued surveillance. You will have a CT scan and labs done next in 3 months.  Follow-up as scheduled.  Thank you for choosing [facility] to provide your oncology and hematology care.   To afford each patient quality time with our provider, please arrive at least 15 minutes before your scheduled appointment time. You may need to reschedule your appointment if you arrive late (10 or more minutes). Arriving late affects you and other patients whose appointments are after yours.  Also, if you miss three or more appointments without notifying the office, you may be dismissed from the clinic at the provider's discretion.    Again, thank you for choosing [facility].  Our hope is that these requests will decrease the amount of time that you wait before being seen by our physicians.   If you have a lab appointment with the [facility] please come in thru the Main Entrance and check in at the main information desk.           _____________________________________________________________  Should you have questions after your visit to [facility], please contact our office at [phone] and follow the prompts.  Our office hours are 8:00 a.m. to 4:30 p.m. Monday - Thursday and 8:00 a.m. to 2:30 p.m. Friday.  Please note that voicemails left after 4:00 p.m. may not be returned until the following business day.  We are closed weekends and all major holidays.  You do have access to a nurse 24-7,  just call the main number to the clinic [phone] and do not press any options, hold on the line and a nurse will answer the phone.    For prescription refill requests, have your pharmacy contact our office and allow 72 hours.    Masks are optional in the cancer centers. If you would like for your care team to wear a mask while they are taking care of you, please let them know. You may have one support person who is at least 50 years old accompany you for your appointments.

## 2022-06-03 NOTE — Progress Notes (Signed)
[facility] NOTE  Patient Care Team: Sasser, Silvestre Moment, MD as PCP - General (Cardiology) Derek Jack, MD as Medical Oncologist (Medical Oncology)  CHIEF COMPLAINTS/PURPOSE OF CONSULTATION:  Stage II sigmoid colon cancer.  HISTORY OF PRESENTING ILLNESS:  Jose Melendez 52 y.o. male is seen in consultation today at the request of [facility] for follow-up of for sigmoid colon cancer.  He was diagnosed with stage II sigmoid colon cancer when he presented with perforation in September 2022 and underwent resection.  He was initially seen at [facility] and was started on [place] ox regimen for 2 cycles.  Patient later transferred his care to [facility] to be close to home.  He has completed total of 7 cycles of capecitabine and oxaliplatin.  He could not tolerate 8 cycle because of neuropathy and pains.  He is currently not working.  He is taking oxycodone and gabapentin for neuropathic pains.  He lives at home with his wife and is independent of ADLs and IADLs.  MEDICAL HISTORY:  History reviewed. No pertinent past medical history.  SURGICAL HISTORY: History reviewed. No pertinent surgical history.  SOCIAL HISTORY: Social History   Socioeconomic History   Marital status: Single    Spouse name: Not on file   Number of children: Not on file   Years of education: Not on file   Highest education level: Not on file  Occupational History   Not on file  Tobacco Use   Smoking status: Every Day    Types: Cigarettes   Smokeless tobacco: Not on file  Substance and Sexual Activity   Alcohol use: Not Currently   Drug use: Never   Sexual activity: Not on file  Other Topics Concern   Not on file  Social History Narrative   Not on file   Social Determinants of Health   Financial Resource Strain: Not on file  Food Insecurity: No Food Insecurity (06/03/2022)   Hunger Vital Sign    Worried About Running Out of Food in the Last Year: Never true     Ran Out of Food in the Last Year: Never true  Transportation Needs: No Transportation Needs (06/03/2022)   PRAPARE - Hydrologist (Medical): No    Lack of Transportation (Non-Medical): No  Physical Activity: Not on file  Stress: Not on file  Social Connections: Not on file  Intimate Partner Violence: Not At Risk (06/03/2022)   Humiliation, Afraid, Rape, and Kick questionnaire    Fear of Current or Ex-Partner: No    Emotionally Abused: No    Physically Abused: No    Sexually Abused: No    FAMILY HISTORY: History reviewed. No pertinent family history.  ALLERGIES:  has No Known Allergies.  MEDICATIONS:  Current Outpatient Medications  Medication Sig Dispense Refill   buPROPion (WELLBUTRIN SR) 150 MG 12 hr tablet Take by mouth.     DULoxetine (CYMBALTA) 30 MG capsule Take 2 capsules (60 mg total) by mouth daily. Take '30mg'$  (1 capsule) daily for 7 days, then increase to '60mg'$  daily 60 capsule 3   ferrous sulfate 325 (65 FE) MG EC tablet Take by mouth.     gabapentin (NEURONTIN) 300 MG capsule Take by mouth.     melatonin 3 MG TABS tablet Take by mouth.     Ostomy Supplies MISC by Miscellaneous route.     oxyCODONE-acetaminophen (PERCOCET) 7.5-325 MG tablet Take by mouth.     No current facility-administered medications  for this visit.    REVIEW OF SYSTEMS:   Constitutional: Denies fevers, chills or abnormal night sweats Eyes: Denies blurriness of vision, double vision or watery eyes Ears, nose, mouth, throat, and face: Denies mucositis or sore throat Respiratory: Denies cough, dyspnea or wheezes Cardiovascular: Denies palpitation, chest discomfort or lower extremity swelling Gastrointestinal:  Denies nausea, heartburn or change in bowel habits Skin: Denies abnormal skin rashes Lymphatics: Denies new lymphadenopathy or easy bruising Neurological: Numbness and tingling in the hands feet and left leg. Behavioral/Psych: Mood is stable, no new changes   All other systems were reviewed with the patient and are negative.  PHYSICAL EXAMINATION: ECOG PERFORMANCE STATUS: 1 - Symptomatic but completely ambulatory  Vitals:   06/03/22 1341  BP: (!) 144/70  Pulse: 88  Resp: 18  Temp: 97.8 F (36.6 C)  SpO2: 96%   Filed Weights   06/03/22 1341  Weight: (!) 328 lb 6.4 oz (149 kg)    GENERAL:alert, no distress and comfortable SKIN: skin color, texture, turgor are normal, no rashes or significant lesions EYES: normal, conjunctiva are pink and non-injected, sclera clear OROPHARYNX:no exudate, no erythema and lips, buccal mucosa, and tongue normal  NECK: supple, thyroid normal size, non-tender, without nodularity LYMPH:  no palpable lymphadenopathy in the cervical, axillary or inguinal LUNGS: clear to auscultation and percussion with normal breathing effort HEART: regular rate & rhythm and no murmurs and no lower extremity edema ABDOMEN:abdomen soft, non-tender and normal bowel sounds.  Left lower quadrant colostomy. Musculoskeletal:no cyanosis of digits and no clubbing  PSYCH: alert & oriented x 3 with fluent speech NEURO: no focal motor/sensory deficits  LABORATORY DATA:  I have reviewed the data as listed Lab Results  Component Value Date   WBC 23.6 (H) 01/13/2021   HGB 9.5 (L) 01/13/2021   HCT 31.2 (L) 01/13/2021   MCV 80.4 01/13/2021   PLT 739 (H) 01/13/2021     Chemistry      Component Value Date/Time   NA 131 (L) 01/13/2021 1321   K 4.7 01/13/2021 1321   CL 91 (L) 01/13/2021 1321   CO2 30 01/13/2021 1321   BUN 10 01/13/2021 1321   CREATININE 0.55 (L) 01/13/2021 1321      Component Value Date/Time   CALCIUM 9.1 01/13/2021 1321   ALKPHOS 160 (H) 01/13/2021 0815   AST 32 01/13/2021 0815   ALT 27 01/13/2021 0815   BILITOT 0.5 01/13/2021 0815       RADIOGRAPHIC STUDIES: I have personally reviewed the radiological images as listed and agreed with the findings in the report. No results found.  ASSESSMENT:  1.   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  Tumor invades into pericolorectal tissue.  Negative margins.  No LVI/PNI.  0/23 lymph nodes positive.  MMR proficient. - 01/24/2021: CT DNA negative. - 01/29/2021: CT chest: Several scattered lung nodules more on the left, largest nodule 1 x 0.6 cm in the lingular segment. - Adjuvant CapeOx from 02/13/2021 through 06/07/2021 (7 cycles) - Colonoscopy (02/15/2021): No evidence of disease.  2.  Social/family history: - Lives at home with his wife.  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  He is currently not working.  He is trying to get on disability.  He is a current active smoker, 1 pack/day for the last 33 years. - No family  history of malignancies.  PLAN:  1.  Stage II sigmoid colon adenocarcinoma: - We reviewed PET scan results from 04/07/2022 showing enlarging lung nodules bilaterally some of which demonstrate central cavitation, remaining highly suspicious for progressive metastatic disease.  Nodules are too small to optimally evaluated by PET/CT.  No other evidence of metastatic disease. - CT CAP on 01/23/2022: Interval development of multiple bilateral lung nodules. - I have reviewed prior records and imaging scans. - I have recommended repeating a CT chest with contrast in 3 months with a CEA level and routine labs. - He is planning to have colostomy reversal with his surgeon at [facility].  2.  Chemotherapy-induced peripheral neuropathy: - He has neuropathy in the hands and feet and left leg with pains. - He is taking gabapentin 300 mg twice daily. - He is currently on Wellbutrin for depression which was only recently started.  He did not take more than 2 to 3 pills.  I have recommended to hold off on it and start Cymbalta 30 mg daily for 1 week with increased to 60  mg daily to see if it also helps with peripheral neuropathy. - Continue oxycodone 7.5/325 every 8 hours as needed for neuropathic pains.   Orders Placed This Encounter  Procedures   CT Chest W Contrast    Standing Status:   Future    Standing Expiration Date:   06/03/2023    Order Specific Question:   If indicated for the ordered procedure, I authorize the administration of contrast media per Radiology protocol    Answer:   Yes    Order Specific Question:   Does the patient have a contrast media/X-ray dye allergy?    Answer:   No    Order Specific Question:   Preferred imaging location?    Answer:   [facility]    Order Specific Question:   Release to patient    Answer:   Immediate [1]   CBC with Differential    Standing Status:   Future    Standing Expiration Date:   06/03/2023   Comprehensive metabolic panel    Standing Status:   Future    Standing Expiration Date:   06/03/2023   Magnesium    Standing Status:   Future    Standing Expiration Date:   06/03/2023   CEA    Standing Status:   Future    Standing Expiration Date:   06/03/2023    All questions were answered. The patient knows to call the clinic with any problems, questions or concerns.      Derek Jack, MD 06/03/2022 5:50 PM

## 2022-06-03 NOTE — Progress Notes (Signed)
Port flushed with no blood return noted, flushed easily. No bruising or swelling at site. Bandaid applied and patient discharged in satisfactory condition. VVS stable with no signs or symptoms of distressed noted.

## 2022-06-09 ENCOUNTER — Encounter (HOSPITAL_COMMUNITY): Payer: Self-pay | Admitting: *Deleted

## 2022-06-09 NOTE — Progress Notes (Signed)
PA approval received for Oxycodone-Acetaminophen 7.5-325 tabs from 06/06/22-06/07/23.  [facility] notified as well as the patient.  He was advised that he can get a full fill on 06/12/22.

## 2022-06-12 ENCOUNTER — Other Ambulatory Visit: Payer: Self-pay | Admitting: Hematology

## 2022-06-12 ENCOUNTER — Other Ambulatory Visit: Payer: Self-pay | Admitting: *Deleted

## 2022-06-12 MED ORDER — OXYCODONE-ACETAMINOPHEN 7.5-325 MG PO TABS: 1.0000 | ORAL_TABLET | Freq: Three times a day (TID) | ORAL | 0 refills | Status: DC | PRN

## 2022-06-12 MED ORDER — OXYCODONE-ACETAMINOPHEN 7.5-325 MG PO TABS: 1.0000 | ORAL_TABLET | Freq: Three times a day (TID) | ORAL | 0 refills | Status: AC | PRN

## 2022-06-24 DIAGNOSIS — C187 Malignant neoplasm of sigmoid colon: Secondary | ICD-10-CM | POA: Diagnosis not present

## 2022-06-25 ENCOUNTER — Other Ambulatory Visit: Payer: Self-pay

## 2022-06-25 ENCOUNTER — Other Ambulatory Visit: Payer: Self-pay | Admitting: *Deleted

## 2022-06-25 DIAGNOSIS — C187 Malignant neoplasm of sigmoid colon: Secondary | ICD-10-CM

## 2022-06-25 DIAGNOSIS — Z939 Artificial opening status, unspecified: Secondary | ICD-10-CM

## 2022-06-25 MED ORDER — MISC. DEVICES MISC: 12 refills | Status: DC

## 2022-06-25 MED ORDER — OSTOMY SUPPLIES MISC: 12 refills | Status: DC

## 2022-06-26 ENCOUNTER — Telehealth: Payer: Self-pay | Admitting: *Deleted

## 2022-06-26 NOTE — Telephone Encounter (Signed)
Received VM requesting call regarding his appt 2/12

## 2022-06-30 ENCOUNTER — Telehealth (INDEPENDENT_AMBULATORY_CARE_PROVIDER_SITE_OTHER): Payer: Self-pay | Admitting: *Deleted

## 2022-06-30 ENCOUNTER — Ambulatory Visit (INDEPENDENT_AMBULATORY_CARE_PROVIDER_SITE_OTHER): Payer: 59 | Admitting: Gastroenterology

## 2022-06-30 NOTE — Telephone Encounter (Signed)
Patient left message that he was sick and needed to cancel appt today.   [phone].

## 2022-07-08 ENCOUNTER — Encounter (INDEPENDENT_AMBULATORY_CARE_PROVIDER_SITE_OTHER): Payer: Self-pay | Admitting: *Deleted

## 2022-07-08 ENCOUNTER — Ambulatory Visit (INDEPENDENT_AMBULATORY_CARE_PROVIDER_SITE_OTHER): Payer: 59 | Admitting: Gastroenterology

## 2022-07-08 ENCOUNTER — Telehealth (INDEPENDENT_AMBULATORY_CARE_PROVIDER_SITE_OTHER): Payer: Self-pay | Admitting: *Deleted

## 2022-07-08 ENCOUNTER — Encounter (INDEPENDENT_AMBULATORY_CARE_PROVIDER_SITE_OTHER): Payer: Self-pay | Admitting: Gastroenterology

## 2022-07-08 VITALS — BP 117/82 | HR 111 | Temp 99.4°F | Ht 68.0 in | Wt 324.1 lb

## 2022-07-08 DIAGNOSIS — Z85038 Personal history of other malignant neoplasm of large intestine: Secondary | ICD-10-CM | POA: Diagnosis not present

## 2022-07-08 DIAGNOSIS — Z1211 Encounter for screening for malignant neoplasm of colon: Secondary | ICD-10-CM

## 2022-07-08 MED ORDER — PEG 3350-KCL-NA BICARB-NACL 420 G PO SOLR: 4000.0000 mL | Freq: Once | ORAL | 0 refills | Status: AC

## 2022-07-08 NOTE — Patient Instructions (Signed)
We will get you scheduled for colonoscopy Please be mindful of the importance of quitting smoking completely, especially given your history  It was a pleasure to meet you today. I want to create trusting relationships with patients and provide genuine, compassionate, and quality care. I truly value your feedback! please be on the lookout for a survey regarding your visit with me today. I appreciate your input about our visit and your time in completing this!    Manjit Bufano L. Alver Sorrow, MSN, APRN, AGNP-C Adult-Gerontology Nurse Practitioner [facility]
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
## 2022-07-08 NOTE — Telephone Encounter (Signed)
Called pt, LMOVM with pre-op appointment details.

## 2022-07-14 ENCOUNTER — Other Ambulatory Visit: Payer: Self-pay

## 2022-07-14 ENCOUNTER — Other Ambulatory Visit: Payer: Self-pay | Admitting: *Deleted

## 2022-07-14 MED ORDER — OXYCODONE-ACETAMINOPHEN 7.5-325 MG PO TABS: 1.0000 | ORAL_TABLET | Freq: Three times a day (TID) | ORAL | 0 refills | Status: DC | PRN

## 2022-07-15 NOTE — Patient Instructions (Signed)
Jose Melendez  07/15/2022     '@PREFPERIOPPHARMACY'$ @   Your procedure is scheduled on  07/18/2022.   Report to [facility] at  [place].   Call this number if you have problems the morning of surgery:  [phone]  If you experience any cold or flu symptoms such as cough, fever, chills, shortness of breath, etc. between now and your scheduled surgery, please notify us at the above number.   Remember:  Follow the diet and prep instructions given to you by the office.    Take these medicines the morning of surgery with A SIP OF WATER                     gabapentin, oxycodone(if needed).     Do not wear jewelry, make-up or nail polish.  Do not wear lotions, powders, or perfumes, or deodorant.  Do not shave 48 hours prior to surgery.  Men may shave face and neck.  Do not bring valuables to the hospital.  [facility] is not responsible for any belongings or valuables.  Contacts, dentures or bridgework may not be worn into surgery.  Leave your suitcase in the car.  After surgery it may be brought to your room.  For patients admitted to the hospital, discharge time will be determined by your treatment team.  Patients discharged the day of surgery will not be allowed to drive home and must have someone with them for 24 hours.    Special instructions:   DO NOT smoke tobacco or vape for 24 hours before your procedure.  Please read over the following fact sheets that you were given. Anesthesia Post-op Instructions and Care and Recovery After Surgery      Colonoscopy, Adult, Care After The following information offers guidance on how to care for yourself after your procedure. Your health care provider may also give you more specific instructions. If you have problems or questions, contact your health care provider. What can I expect after the procedure? After the procedure, it is common to have: A small amount of blood in your stool for 24 hours after the procedure. Some  gas. Mild cramping or bloating of your abdomen. Follow these instructions at home: Eating and drinking  Drink enough fluid to keep your urine pale yellow. Follow instructions from your health care provider about eating or drinking restrictions. Resume your normal diet as told by your health care provider. Avoid heavy or fried foods that are hard to digest. Activity Rest as told by your health care provider. Avoid sitting for a long time without moving. Get up to take short walks every 1-2 hours. This is important to improve blood flow and breathing. Ask for help if you feel weak or unsteady. Return to your normal activities as told by your health care provider. Ask your health care provider what activities are safe for you. Managing cramping and bloating  Try walking around when you have cramps or feel bloated. If directed, apply heat to your abdomen as told by your health care provider. Use the heat source that your health care provider recommends, such as a moist heat pack or a heating pad. Place a towel between your skin and the heat source. Leave the heat on for 20-30 minutes. Remove the heat if your skin turns bright red. This is especially important if you are unable to feel pain, heat, or cold. You have a greater risk of getting burned. General instructions  If you were given a sedative during the procedure, it can affect you for several hours. Do not drive or operate machinery until your health care provider says that it is safe. For the first 24 hours after the procedure: Do not sign important documents. Do not drink alcohol. Do your regular daily activities at a slower pace than normal. Eat soft foods that are easy to digest. Take over-the-counter and prescription medicines only as told by your health care provider. Keep all follow-up visits. This is important. Contact a health care provider if: You have blood in your stool 2-3 days after the procedure. Get help right away  if: You have more than a small spotting of blood in your stool. You have large blood clots in your stool. You have swelling of your abdomen. You have nausea or vomiting. You have a fever. You have increasing pain in your abdomen that is not relieved with medicine. These symptoms may be an emergency. Get help right away. Call 911. Do not wait to see if the symptoms will go away. Do not drive yourself to the hospital. Summary After the procedure, it is common to have a small amount of blood in your stool. You may also have mild cramping and bloating of your abdomen. If you were given a sedative during the procedure, it can affect you for several hours. Do not drive or operate machinery until your health care provider says that it is safe. Get help right away if you have a lot of blood in your stool, nausea or vomiting, a fever, or increased pain in your abdomen. This information is not intended to replace advice given to you by your health care provider. Make sure you discuss any questions you have with your health care provider. Document Revised: 12/26/2020 Document Reviewed: 12/26/2020 Elsevier Patient Education  [place] After The following information offers guidance on how to care for yourself after your procedure. Your health care provider may also give you more specific instructions. If you have problems or questions, contact your health care provider. What can I expect after the procedure? After the procedure, it is common to have: Tiredness. Little or no memory about what happened during or after the procedure. Impaired judgment when it comes to making decisions. Nausea or vomiting. Some trouble with balance. Follow these instructions at home: For the time period you were told by your health care provider:  Rest. Do not participate in activities where you could fall or become injured. Do not drive or use machinery. Do not drink  alcohol. Do not take sleeping pills or medicines that cause drowsiness. Do not make important decisions or sign legal documents. Do not take care of children on your own. Medicines Take over-the-counter and prescription medicines only as told by your health care provider. If you were prescribed antibiotics, take them as told by your health care provider. Do not stop using the antibiotic even if you start to feel better. Eating and drinking Follow instructions from your health care provider about what you may eat and drink. Drink enough fluid to keep your urine pale yellow. If you vomit: Drink clear fluids slowly and in small amounts as you are able. Clear fluids include water, ice chips, low-calorie sports drinks, and fruit juice that has water added to it (diluted fruit juice). Eat light and bland foods in small amounts as you are able. These foods include bananas, applesauce, rice, lean meats, toast, and crackers. General instructions  Have  a responsible adult stay with you for the time you are told. It is important to have someone help care for you until you are awake and alert. If you have sleep apnea, surgery and some medicines can increase your risk for breathing problems. Follow instructions from your health care provider about wearing your sleep device: When you are sleeping. This includes during daytime naps. While taking prescription pain medicines, sleeping medicines, or medicines that make you drowsy. Do not use any products that contain nicotine or tobacco. These products include cigarettes, chewing tobacco, and vaping devices, such as e-cigarettes. If you need help quitting, ask your health care provider. Contact a health care provider if: You feel nauseous or vomit every time you eat or drink. You feel light-headed. You are still sleepy or having trouble with balance after 24 hours. You get a rash. You have a fever. You have redness or swelling around the IV site. Get help  right away if: You have trouble breathing. You have new confusion after you get home. These symptoms may be an emergency. Get help right away. Call 911. Do not wait to see if the symptoms will go away. Do not drive yourself to the hospital. This information is not intended to replace advice given to you by your health care provider. Make sure you discuss any questions you have with your health care provider. Document Revised: 09/30/2021 Document Reviewed: 09/30/2021 Elsevier Patient Education  [place].

## 2022-07-16 ENCOUNTER — Encounter (HOSPITAL_COMMUNITY)
Admission: RE | Admit: 2022-07-16 | Discharge: 2022-07-16 | Disposition: A | Discharge status: Home / Self Care | Payer: 59 | Source: Ambulatory Visit | Attending: Gastroenterology | Admitting: Gastroenterology

## 2022-07-16 ENCOUNTER — Other Ambulatory Visit: Payer: Self-pay

## 2022-07-16 ENCOUNTER — Encounter (HOSPITAL_COMMUNITY): Payer: Self-pay

## 2022-07-16 VITALS — Ht 68.0 in | Wt 324.1 lb

## 2022-07-16 DIAGNOSIS — R638 Other symptoms and signs concerning food and fluid intake: Secondary | ICD-10-CM | POA: Insufficient documentation

## 2022-07-16 DIAGNOSIS — I4519 Other right bundle-branch block: Secondary | ICD-10-CM | POA: Insufficient documentation

## 2022-07-18 ENCOUNTER — Ambulatory Visit (HOSPITAL_BASED_OUTPATIENT_CLINIC_OR_DEPARTMENT_OTHER): Payer: 59 | Admitting: Anesthesiology

## 2022-07-18 ENCOUNTER — Ambulatory Visit (HOSPITAL_COMMUNITY): Payer: 59 | Admitting: Anesthesiology

## 2022-07-18 ENCOUNTER — Ambulatory Visit (HOSPITAL_COMMUNITY)
Admission: RE | Admit: 2022-07-18 | Discharge: 2022-07-18 | Disposition: A | Discharge status: Home / Self Care | Payer: 59 | Attending: Gastroenterology | Admitting: Gastroenterology

## 2022-07-18 ENCOUNTER — Encounter (HOSPITAL_COMMUNITY)
Admission: RE | Disposition: A | Discharge status: Home / Self Care | Payer: Self-pay | Source: Home / Self Care | Attending: Gastroenterology

## 2022-07-18 ENCOUNTER — Other Ambulatory Visit: Payer: Self-pay

## 2022-07-18 ENCOUNTER — Encounter (HOSPITAL_COMMUNITY): Payer: Self-pay | Admitting: Gastroenterology

## 2022-07-18 DIAGNOSIS — Z6841 Body Mass Index (BMI) 40.0 and over, adult: Secondary | ICD-10-CM | POA: Diagnosis not present

## 2022-07-18 DIAGNOSIS — D123 Benign neoplasm of transverse colon: Secondary | ICD-10-CM | POA: Diagnosis not present

## 2022-07-18 DIAGNOSIS — Z79899 Other long term (current) drug therapy: Secondary | ICD-10-CM | POA: Diagnosis not present

## 2022-07-18 DIAGNOSIS — D122 Benign neoplasm of ascending colon: Secondary | ICD-10-CM | POA: Diagnosis not present

## 2022-07-18 DIAGNOSIS — Z9221 Personal history of antineoplastic chemotherapy: Secondary | ICD-10-CM | POA: Insufficient documentation

## 2022-07-18 DIAGNOSIS — Z08 Encounter for follow-up examination after completed treatment for malignant neoplasm: Secondary | ICD-10-CM | POA: Diagnosis not present

## 2022-07-18 DIAGNOSIS — G622 Polyneuropathy due to other toxic agents: Secondary | ICD-10-CM | POA: Diagnosis not present

## 2022-07-18 DIAGNOSIS — F17200 Nicotine dependence, unspecified, uncomplicated: Secondary | ICD-10-CM | POA: Diagnosis not present

## 2022-07-18 DIAGNOSIS — Z933 Colostomy status: Secondary | ICD-10-CM | POA: Insufficient documentation

## 2022-07-18 DIAGNOSIS — T451X5A Adverse effect of antineoplastic and immunosuppressive drugs, initial encounter: Secondary | ICD-10-CM | POA: Diagnosis not present

## 2022-07-18 DIAGNOSIS — F17210 Nicotine dependence, cigarettes, uncomplicated: Secondary | ICD-10-CM | POA: Diagnosis not present

## 2022-07-18 DIAGNOSIS — Z85038 Personal history of other malignant neoplasm of large intestine: Secondary | ICD-10-CM | POA: Diagnosis not present

## 2022-07-18 DIAGNOSIS — D120 Benign neoplasm of cecum: Secondary | ICD-10-CM | POA: Diagnosis not present

## 2022-07-18 DIAGNOSIS — Z1211 Encounter for screening for malignant neoplasm of colon: Secondary | ICD-10-CM | POA: Diagnosis not present

## 2022-07-18 DIAGNOSIS — K635 Polyp of colon: Secondary | ICD-10-CM | POA: Diagnosis not present

## 2022-07-18 HISTORY — PX: BIOPSY: SHX5522

## 2022-07-18 HISTORY — PX: COLONOSCOPY WITH PROPOFOL: SHX5780

## 2022-07-18 HISTORY — PX: POLYPECTOMY: SHX5525

## 2022-07-18 LAB — HM COLONOSCOPY

## 2022-07-18 SURGERY — COLONOSCOPY WITH PROPOFOL
Anesthesia: General

## 2022-07-18 MED ORDER — PROPOFOL 10 MG/ML IV BOLUS
INTRAVENOUS | Status: DC | PRN
  Administered 2022-07-18: 100 mg via INTRAVENOUS

## 2022-07-18 MED ORDER — PHENYLEPHRINE 80 MCG/ML (10ML) SYRINGE FOR IV PUSH (FOR BLOOD PRESSURE SUPPORT)
PREFILLED_SYRINGE | INTRAVENOUS | Status: DC | PRN
  Administered 2022-07-18 (×2): 80 ug via INTRAVENOUS

## 2022-07-18 MED ORDER — GLYCOPYRROLATE 0.2 MG/ML IJ SOLN
INTRAMUSCULAR | Status: DC | PRN
  Administered 2022-07-18: .1 mg via INTRAVENOUS

## 2022-07-18 MED ORDER — LIDOCAINE HCL (CARDIAC) PF 100 MG/5ML IV SOSY
PREFILLED_SYRINGE | INTRAVENOUS | Status: DC | PRN
  Administered 2022-07-18: 100 mg via INTRAVENOUS
  Administered 2022-07-18: 50 mg via INTRAVENOUS

## 2022-07-18 MED ORDER — PROPOFOL 500 MG/50ML IV EMUL
INTRAVENOUS | Status: DC | PRN
  Administered 2022-07-18: 150 ug/kg/min via INTRAVENOUS

## 2022-07-18 MED ORDER — LACTATED RINGERS IV SOLN
INTRAVENOUS | Status: DC
  Administered 2022-07-18: 1000 mL via INTRAVENOUS

## 2022-07-18 NOTE — Discharge Instructions (Signed)
You are being discharged to home.  Resume your previous diet.  We are waiting for your pathology results.  Your physician has recommended a repeat colonoscopy in one year for surveillance.  Proceed with ostomy takedown.

## 2022-07-18 NOTE — Anesthesia Preprocedure Evaluation (Signed)
Anesthesia Evaluation  Patient identified by MRN, date of birth, ID band Patient awake    Reviewed: Allergy & Precautions, H&P , NPO status , Patient's Chart, lab work & pertinent test results  Airway Mallampati: III  TM Distance: >3 FB Neck ROM: Full    Dental  (+) Dental Advisory Given, Chipped   Pulmonary Current Smoker and Patient abstained from smoking.   Pulmonary exam normal breath sounds clear to auscultation       Cardiovascular negative cardio ROS Normal cardiovascular exam Rhythm:Regular Rate:Normal     Neuro/Psych negative neurological ROS  negative psych ROS   GI/Hepatic Neg liver ROS, Bowel prep,,,Sigmoid colon cancer   Endo/Other    Morbid obesity  Renal/GU negative Renal ROS  negative genitourinary   Musculoskeletal negative musculoskeletal ROS (+)    Abdominal   Peds negative pediatric ROS (+)  Hematology negative hematology ROS (+)   Anesthesia Other Findings   Reproductive/Obstetrics negative OB ROS                             Anesthesia Physical Anesthesia Plan  ASA: 3  Anesthesia Plan: General   Post-op Pain Management: Minimal or no pain anticipated   Induction: Intravenous  PONV Risk Score and Plan: Propofol infusion  Airway Management Planned: Nasal Cannula and Natural Airway  Additional Equipment:   Intra-op Plan:   Post-operative Plan:   Informed Consent: I have reviewed the patients History and Physical, chart, labs and discussed the procedure including the risks, benefits and alternatives for the proposed anesthesia with the patient or authorized representative who has indicated his/her understanding and acceptance.     Dental advisory given  Plan Discussed with: CRNA and Surgeon  Anesthesia Plan Comments:         Anesthesia Quick Evaluation

## 2022-07-18 NOTE — Transfer of Care (Signed)
Immediate Anesthesia Transfer of Care Note  Patient: Jose Melendez  Procedure(s) Performed: COLONOSCOPY WITH PROPOFOL POLYPECTOMY BIOPSY  Patient Location: PACU  Anesthesia Type:General  Level of Consciousness: awake, alert , and oriented  Airway & Oxygen Therapy: Patient Spontanous Breathing and Patient connected to face mask oxygen  Post-op Assessment: Report given to RN and Post -op Vital signs reviewed and stable  Post vital signs: Reviewed and stable  Last Vitals:  Vitals Value Taken Time  BP 90/48   Temp 97.9   Pulse 95 07/18/22 1125  Resp 23 07/18/22 1125  SpO2 96 % 07/18/22 1125  Vitals shown include unvalidated device data.  Last Pain:  Vitals:   07/18/22 1025  TempSrc:   PainSc: 6          Complications: No notable events documented.

## 2022-07-18 NOTE — Op Note (Signed)
[facility] Patient Name: Jose Melendez Procedure Date: 07/18/2022 10:16 AM MRN: [id number] Date of Birth: 08-12-1970 Attending MD: Maylon Peppers , , [id number] CSN: [id number] Age: 52 Admit Type: Outpatient Procedure:                Colonoscopy Indications:              High risk colon cancer surveillance: Personal                            history of colon cancer Providers:                Maylon Peppers, Gwynneth Albright RN, RN,                            Ladoris Gene Technician, Technician Referring MD:              Medicines:                Monitored Anesthesia Care Complications:            No immediate complications. Estimated Blood Loss:     Estimated blood loss: none. Procedure:                Pre-Anesthesia Assessment:                           - Prior to the procedure, a History and Physical                            was performed, and patient medications, allergies                            and sensitivities were reviewed. The patient's                            tolerance of previous anesthesia was reviewed.                           - The risks and benefits of the procedure and the                            sedation options and risks were discussed with the                            patient. All questions were answered and informed                            consent was obtained.                           - ASA Grade Assessment: III - A patient with severe                            systemic disease.                           After obtaining informed consent, the colonoscope  was passed under direct vision. Throughout the                            procedure, the patient's blood pressure, pulse, and                            oxygen saturations were monitored continuously. The                            [id number] scope was introduced through                            the sigmoid colostomy and advanced to the the                             cecum, identified by appendiceal orifice and                            ileocecal valve. The quality of the bowel                            preparation was adequate to identify polyps greater                            than 5 mm in size. The colonoscopy was performed                            with difficulty due to ostomy hernia. The patient                            tolerated the procedure fairly well. Scope In: 10:32:46 AM Scope Out: 11:18:22 AM Scope Withdrawal Time: 0 hours 14 minutes 48 seconds  Total Procedure Duration: 0 hours 45 minutes 36 seconds  Findings:      Nine sessile polyps were found in the transverse colon, ascending colon       and cecum. The polyps were 2 to 10 mm in size. These polyps were removed       with a cold snare. Resection and retrieval were complete.      Ostomy appeared to be pink and healthy. 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      After withdrawing the scope from ostomy, I examined the excluded       rectosigmoid area.      The perianal and digital rectal examinations were normal.      The rectum and sigmoid colon appeared normal, although with some atrophy       due to diversion. Hartmann pouch was located at 20 cm. Impression:               - Nine 2 to 10 mm polyps in the transverse colon,                            in the ascending colon and in the cecum, removed  with a cold snare. Resected and retrieved.                           - The rectum and sigmoid colon are normal. Moderate Sedation:      Per Anesthesia Care Recommendation:           - Discharge patient to home (ambulatory).                           - Resume previous diet.                           - Await pathology results.                           - Repeat colonoscopy in 1 year for surveillance.                           - Proceed with ostomy takedown. Procedure Code(s):        ---  Professional ---                           [phone], Colonoscopy through stoma; with removal of                            tumor(s), polyp(s), or other lesion(s) by snare                            technique Diagnosis Code(s):        --- Professional ---                           [id number], Personal history of other malignant                            neoplasm of large intestine                           D12.3, Benign neoplasm of transverse colon (hepatic                            flexure or splenic flexure)                           D12.2, Benign neoplasm of ascending colon                           D12.0, Benign neoplasm of cecum CPT copyright 2022 American Medical Association. All rights reserved. The codes documented in this report are preliminary and upon coder review may  be revised to meet current compliance requirements. Maylon Peppers, MD Maylon Peppers,  07/18/2022 11:35:42 AM This report has been signed electronically. Number of Addenda: 0

## 2022-07-18 NOTE — Anesthesia Postprocedure Evaluation (Signed)
Anesthesia Post Note  Patient: Jose Melendez  Procedure(s) Performed: COLONOSCOPY WITH PROPOFOL POLYPECTOMY BIOPSY  Patient location during evaluation: Phase II Anesthesia Type: General Level of consciousness: awake and alert and oriented Pain management: pain level controlled Vital Signs Assessment: post-procedure vital signs reviewed and stable Respiratory status: spontaneous breathing, nonlabored ventilation and respiratory function stable Cardiovascular status: blood pressure returned to baseline and stable Postop Assessment: no apparent nausea or vomiting Anesthetic complications: no  No notable events documented.   Last Vitals:  Vitals:   07/18/22 1138 07/18/22 1143  BP: 118/75 132/79  Pulse: 94 92  Resp: 18 20  Temp:  36.9 C  SpO2: 99% 96%    Last Pain:  Vitals:   07/18/22 1143  TempSrc: Axillary  PainSc: 0-No pain                 Vernice Mannina C Eiza Canniff

## 2022-07-18 NOTE — Interval H&P Note (Signed)
History and Physical Interval Note:  07/18/2022 10:18 AM  Jose Melendez  has presented today for surgery, with the diagnosis of h/o sigmoid colon cancer.  The various methods of treatment have been discussed with the patient and family. After consideration of risks, benefits and other options for treatment, the patient has consented to  Procedure(s) with comments: COLONOSCOPY WITH PROPOFOL (N/A) - 1130am, asa 3 as a surgical intervention.  The patient's history has been reviewed, patient examined, no change in status, stable for surgery.  I have reviewed the patient's chart and labs.  Questions were answered to the patient's satisfaction.     [name]

## 2022-07-21 ENCOUNTER — Encounter (INDEPENDENT_AMBULATORY_CARE_PROVIDER_SITE_OTHER): Payer: Self-pay | Admitting: *Deleted

## 2022-07-21 LAB — SURGICAL PATHOLOGY

## 2022-07-31 ENCOUNTER — Encounter (HOSPITAL_COMMUNITY): Payer: Self-pay | Admitting: Gastroenterology

## 2022-08-11 ENCOUNTER — Other Ambulatory Visit: Payer: Self-pay | Admitting: *Deleted

## 2022-08-11 MED ORDER — OXYCODONE-ACETAMINOPHEN 7.5-325 MG PO TABS: 1.0000 | ORAL_TABLET | Freq: Three times a day (TID) | ORAL | 0 refills | Status: DC | PRN

## 2022-08-12 ENCOUNTER — Other Ambulatory Visit: Payer: Self-pay | Admitting: *Deleted

## 2022-08-13 ENCOUNTER — Other Ambulatory Visit: Payer: Self-pay | Admitting: Hematology

## 2022-08-13 MED ORDER — OXYCODONE-ACETAMINOPHEN 5-325 MG PO TABS: 1.0000 | ORAL_TABLET | Freq: Four times a day (QID) | ORAL | 0 refills | Status: DC | PRN

## 2022-08-14 ENCOUNTER — Other Ambulatory Visit: Payer: Self-pay | Admitting: Hematology

## 2022-09-02 ENCOUNTER — Ambulatory Visit (HOSPITAL_COMMUNITY): Admission: RE | Admit: 2022-09-02 | Payer: 59 | Source: Ambulatory Visit

## 2022-09-02 ENCOUNTER — Inpatient Hospital Stay: Payer: 59 | Attending: Hematology

## 2022-09-09 ENCOUNTER — Ambulatory Visit: Payer: 59 | Admitting: Hematology

## 2022-09-09 DIAGNOSIS — Z6841 Body Mass Index (BMI) 40.0 and over, adult: Secondary | ICD-10-CM | POA: Diagnosis not present

## 2022-09-09 DIAGNOSIS — Z933 Colostomy status: Secondary | ICD-10-CM | POA: Diagnosis not present

## 2022-09-15 ENCOUNTER — Other Ambulatory Visit: Payer: Self-pay

## 2022-09-15 MED ORDER — OXYCODONE-ACETAMINOPHEN 5-325 MG PO TABS: 1.0000 | ORAL_TABLET | Freq: Four times a day (QID) | ORAL | 0 refills | Status: DC | PRN

## 2022-09-18 ENCOUNTER — Inpatient Hospital Stay: Payer: 59 | Admitting: Hematology

## 2022-09-23 ENCOUNTER — Encounter: Payer: Self-pay | Admitting: *Deleted

## 2022-09-26 ENCOUNTER — Inpatient Hospital Stay: Payer: 59

## 2022-09-29 ENCOUNTER — Inpatient Hospital Stay: Payer: 59

## 2022-09-29 ENCOUNTER — Ambulatory Visit (HOSPITAL_COMMUNITY): Payer: 59

## 2022-10-02 ENCOUNTER — Inpatient Hospital Stay: Payer: 59 | Admitting: Hematology

## 2022-10-09 ENCOUNTER — Ambulatory Visit: Payer: 59 | Admitting: General Surgery

## 2022-10-16 ENCOUNTER — Other Ambulatory Visit: Payer: Self-pay

## 2022-10-16 MED ORDER — GABAPENTIN 300 MG PO CAPS: 300.0000 mg | ORAL_CAPSULE | Freq: Three times a day (TID) | ORAL | 0 refills | Status: DC | PRN

## 2022-10-16 MED ORDER — OXYCODONE-ACETAMINOPHEN 5-325 MG PO TABS: 1.0000 | ORAL_TABLET | Freq: Four times a day (QID) | ORAL | 0 refills | Status: DC | PRN

## 2022-10-17 ENCOUNTER — Other Ambulatory Visit: Payer: Self-pay

## 2022-10-17 MED ORDER — OXYCODONE-ACETAMINOPHEN 5-325 MG PO TABS: 1.0000 | ORAL_TABLET | Freq: Four times a day (QID) | ORAL | 0 refills | Status: DC | PRN

## 2022-10-17 MED ORDER — GABAPENTIN 300 MG PO CAPS: 300.0000 mg | ORAL_CAPSULE | Freq: Three times a day (TID) | ORAL | 0 refills | Status: DC | PRN

## 2022-10-20 ENCOUNTER — Encounter (INDEPENDENT_AMBULATORY_CARE_PROVIDER_SITE_OTHER): Payer: Self-pay | Admitting: Gastroenterology

## 2022-10-20 ENCOUNTER — Ambulatory Visit (INDEPENDENT_AMBULATORY_CARE_PROVIDER_SITE_OTHER): Payer: 59 | Admitting: Gastroenterology

## 2022-11-13 ENCOUNTER — Other Ambulatory Visit: Payer: Self-pay | Admitting: *Deleted

## 2022-11-13 DIAGNOSIS — Z85038 Personal history of other malignant neoplasm of large intestine: Secondary | ICD-10-CM | POA: Diagnosis not present

## 2022-11-13 DIAGNOSIS — T451X5A Adverse effect of antineoplastic and immunosuppressive drugs, initial encounter: Secondary | ICD-10-CM | POA: Diagnosis not present

## 2022-11-13 DIAGNOSIS — Z6841 Body Mass Index (BMI) 40.0 and over, adult: Secondary | ICD-10-CM | POA: Diagnosis not present

## 2022-11-13 DIAGNOSIS — G620 Drug-induced polyneuropathy: Secondary | ICD-10-CM | POA: Diagnosis not present

## 2022-11-13 DIAGNOSIS — K5730 Diverticulosis of large intestine without perforation or abscess without bleeding: Secondary | ICD-10-CM | POA: Diagnosis not present

## 2022-11-13 DIAGNOSIS — F17210 Nicotine dependence, cigarettes, uncomplicated: Secondary | ICD-10-CM | POA: Diagnosis not present

## 2022-11-13 DIAGNOSIS — R030 Elevated blood-pressure reading, without diagnosis of hypertension: Secondary | ICD-10-CM | POA: Diagnosis not present

## 2022-11-13 MED ORDER — OXYCODONE-ACETAMINOPHEN 5-325 MG PO TABS: 1.0000 | ORAL_TABLET | Freq: Four times a day (QID) | ORAL | 0 refills | Status: DC | PRN

## 2022-11-17 ENCOUNTER — Other Ambulatory Visit: Payer: Self-pay

## 2022-11-17 MED ORDER — OXYCODONE-ACETAMINOPHEN 5-325 MG PO TABS: 1.0000 | ORAL_TABLET | Freq: Four times a day (QID) | ORAL | 0 refills | Status: DC | PRN

## 2022-11-21 ENCOUNTER — Ambulatory Visit (HOSPITAL_COMMUNITY)
Admission: RE | Admit: 2022-11-21 | Discharge: 2022-11-21 | Disposition: A | Discharge status: Home / Self Care | Payer: 59 | Source: Ambulatory Visit | Attending: Hematology | Admitting: Hematology

## 2022-11-21 ENCOUNTER — Inpatient Hospital Stay: Payer: 59 | Attending: Hematology

## 2022-11-21 DIAGNOSIS — G620 Drug-induced polyneuropathy: Secondary | ICD-10-CM | POA: Insufficient documentation

## 2022-11-21 DIAGNOSIS — F17210 Nicotine dependence, cigarettes, uncomplicated: Secondary | ICD-10-CM | POA: Insufficient documentation

## 2022-11-21 DIAGNOSIS — C189 Malignant neoplasm of colon, unspecified: Secondary | ICD-10-CM | POA: Diagnosis not present

## 2022-11-21 DIAGNOSIS — Z79899 Other long term (current) drug therapy: Secondary | ICD-10-CM | POA: Insufficient documentation

## 2022-11-21 DIAGNOSIS — T451X5A Adverse effect of antineoplastic and immunosuppressive drugs, initial encounter: Secondary | ICD-10-CM | POA: Insufficient documentation

## 2022-11-21 DIAGNOSIS — C187 Malignant neoplasm of sigmoid colon: Secondary | ICD-10-CM | POA: Insufficient documentation

## 2022-11-21 DIAGNOSIS — J439 Emphysema, unspecified: Secondary | ICD-10-CM | POA: Diagnosis not present

## 2022-11-21 DIAGNOSIS — R918 Other nonspecific abnormal finding of lung field: Secondary | ICD-10-CM | POA: Diagnosis not present

## 2022-11-21 LAB — COMPREHENSIVE METABOLIC PANEL
ALT: 27 U/L (ref 0–44)
AST: 20 U/L (ref 15–41)
Albumin: 3.4 g/dL — ABNORMAL LOW (ref 3.5–5.0)
Alkaline Phosphatase: 71 U/L (ref 38–126)
Anion gap: 8 (ref 5–15)
BUN: 11 mg/dL (ref 6–20)
CO2: 30 mmol/L (ref 22–32)
Calcium: 8.3 mg/dL — ABNORMAL LOW (ref 8.9–10.3)
Chloride: 97 mmol/L — ABNORMAL LOW (ref 98–111)
Creatinine, Ser: 0.88 mg/dL (ref 0.61–1.24)
GFR, Estimated: >60 mL/min (ref >60)
Glucose, Bld: 386 mg/dL — ABNORMAL HIGH (ref 70–99)
Potassium: 4.3 mmol/L (ref 3.5–5.1)
Sodium: 135 mmol/L (ref 135–145)
Total Bilirubin: 0.3 mg/dL (ref 0.3–1.2)
Total Protein: 6.3 g/dL — ABNORMAL LOW (ref 6.5–8.1)

## 2022-11-21 LAB — CBC WITH DIFFERENTIAL/PLATELET
Abs Immature Granulocytes: 0.07 10*3/uL (ref 0.00–0.07)
Basophils Absolute: 0 10*3/uL (ref 0.0–0.1)
Basophils Relative: 0 %
Eosinophils Absolute: 0.2 10*3/uL (ref 0.0–0.5)
Eosinophils Relative: 2 %
HCT: 41.8 % (ref 39.0–52.0)
Hemoglobin: 13.2 g/dL (ref 13.0–17.0)
Immature Granulocytes: 1 %
Lymphocytes Relative: 20 %
Lymphs Abs: 2.1 10*3/uL (ref 0.7–4.0)
MCH: 28.4 pg (ref 26.0–34.0)
MCHC: 31.6 g/dL (ref 30.0–36.0)
MCV: 89.9 fL (ref 80.0–100.0)
Monocytes Absolute: 0.5 10*3/uL (ref 0.1–1.0)
Monocytes Relative: 5 %
Neutro Abs: 7.3 10*3/uL (ref 1.7–7.7)
Neutrophils Relative %: 72 %
Platelets: 223 10*3/uL (ref 150–400)
RBC: 4.65 MIL/uL (ref 4.22–5.81)
RDW: 14.4 % (ref 11.5–15.5)
WBC: 10.1 10*3/uL (ref 4.0–10.5)
nRBC: 0 % (ref 0.0–0.2)

## 2022-11-21 LAB — MAGNESIUM: Magnesium: 1.9 mg/dL (ref 1.7–2.4)

## 2022-11-21 MED ORDER — SODIUM CHLORIDE 0.9% FLUSH
10.0000 mL | INTRAVENOUS | Status: DC | PRN
  Administered 2022-11-21: 10 mL via INTRAVENOUS

## 2022-11-21 MED ORDER — HEPARIN SOD (PORK) LOCK FLUSH 100 UNIT/ML IV SOLN
500.0000 [IU] | Freq: Once | INTRAVENOUS | Status: AC
  Administered 2022-11-21: 500 [IU] via INTRAVENOUS

## 2022-11-21 MED ORDER — IOHEXOL 300 MG/ML  SOLN
75.0000 mL | Freq: Once | INTRAMUSCULAR | Status: AC | PRN
  Administered 2022-11-21: 75 mL via INTRAVENOUS

## 2022-11-21 NOTE — Progress Notes (Signed)
Benn Moulder presented for Portacath access and flush.  Portacath located right chest wall accessed with  H 20 needle.  No blood return noted. Portacath flushed with 20ml NS and 500U/57ml Heparin and needle removed intact.  Procedure tolerated well and without incident.     Discharged from clinic ambulatory in stable condition. Alert and oriented x 3. F/U with [facility] as scheduled.

## 2022-11-22 LAB — CEA: CEA: 6.1 ng/mL — ABNORMAL HIGH (ref 0.0–4.7)
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
## 2022-11-27 ENCOUNTER — Inpatient Hospital Stay (HOSPITAL_BASED_OUTPATIENT_CLINIC_OR_DEPARTMENT_OTHER): Payer: 59 | Admitting: Hematology

## 2022-11-27 VITALS — BP 149/92 | HR 85 | Temp 99.4°F | Resp 18 | Wt 352.2 lb

## 2022-11-27 DIAGNOSIS — T451X5A Adverse effect of antineoplastic and immunosuppressive drugs, initial encounter: Secondary | ICD-10-CM | POA: Diagnosis not present

## 2022-11-27 DIAGNOSIS — G620 Drug-induced polyneuropathy: Secondary | ICD-10-CM | POA: Diagnosis not present

## 2022-11-27 DIAGNOSIS — F17210 Nicotine dependence, cigarettes, uncomplicated: Secondary | ICD-10-CM | POA: Diagnosis not present

## 2022-11-27 DIAGNOSIS — C187 Malignant neoplasm of sigmoid colon: Secondary | ICD-10-CM

## 2022-11-27 DIAGNOSIS — Z79899 Other long term (current) drug therapy: Secondary | ICD-10-CM | POA: Diagnosis not present

## 2022-11-27 NOTE — Patient Instructions (Signed)
[facility]  Discharge Instructions  You were seen and examined today by Dr. Ellin Saba.  Dr. Ellin Saba discussed your most recent lab work which revealed that your CEA has went up.  Follow-up as scheduled.    Thank you for choosing [facility] to provide your oncology and hematology care.   To afford each patient quality time with our provider, please arrive at least 15 minutes before your scheduled appointment time. You may need to reschedule your appointment if you arrive late (10 or more minutes). Arriving late affects you and other patients whose appointments are after yours.  Also, if you miss three or more appointments without notifying the office, you may be dismissed from the clinic at the provider's discretion.    Again, thank you for choosing [facility].  Our hope is that these requests will decrease the amount of time that you wait before being seen by our physicians.   If you have a lab appointment with the Cancer Center - please note that after April 8th, all labs will be drawn in the cancer center.  You do not have to check in or register with the main entrance as you have in the past but will complete your check-in at the cancer center.            _____________________________________________________________  Should you have questions after your visit to [facility], please contact our office at [phone] and follow the prompts.  Our office hours are 8:00 a.m. to 4:30 p.m. Monday - Thursday and 8:00 a.m. to 2:30 p.m. Friday.  Please note that voicemails left after 4:00 p.m. may not be returned until the following business day.  We are closed weekends and all major holidays.  You do have access to a nurse 24-7, just call the main number to the clinic [phone] and do not press any options, hold on the line and a nurse will answer the phone.    For prescription refill requests, have your  pharmacy contact our office and allow 72 hours.    Masks are no longer required in the cancer centers. If you would like for your care team to wear a mask while they are taking care of you, please let them know. You may have one support person who is at least 50 years old accompany you for your appointments.

## 2022-12-01 ENCOUNTER — Other Ambulatory Visit: Payer: Self-pay

## 2022-12-01 DIAGNOSIS — C187 Malignant neoplasm of sigmoid colon: Secondary | ICD-10-CM

## 2022-12-01 NOTE — Progress Notes (Signed)
Message received from Jose Massed, MD-  Please call and let him know that CT scan chest results were stable. He needs follow-up in 3 months with CBC, CMP, CEA levels. Thank you.    Jose Melendez aware of results and agreeable with plan for follow up and labs in 3 months. Appointments scheduled by up front schedulers and Jose Melendez is aware of appointment dates and time. Orders placed for labs per Dr. Marice Potter orders.

## 2022-12-02 ENCOUNTER — Inpatient Hospital Stay: Payer: 59

## 2022-12-11 ENCOUNTER — Other Ambulatory Visit: Payer: Self-pay | Admitting: *Deleted

## 2022-12-11 MED ORDER — OXYCODONE-ACETAMINOPHEN 7.5-325 MG PO TABS: 1.0000 | ORAL_TABLET | Freq: Three times a day (TID) | ORAL | 0 refills | Status: DC | PRN

## 2022-12-15 ENCOUNTER — Other Ambulatory Visit: Payer: Self-pay | Admitting: *Deleted

## 2022-12-22 DIAGNOSIS — E119 Type 2 diabetes mellitus without complications: Secondary | ICD-10-CM | POA: Diagnosis not present

## 2022-12-22 DIAGNOSIS — Z6841 Body Mass Index (BMI) 40.0 and over, adult: Secondary | ICD-10-CM | POA: Diagnosis not present

## 2022-12-22 DIAGNOSIS — T451X5A Adverse effect of antineoplastic and immunosuppressive drugs, initial encounter: Secondary | ICD-10-CM | POA: Diagnosis not present

## 2022-12-22 DIAGNOSIS — K5730 Diverticulosis of large intestine without perforation or abscess without bleeding: Secondary | ICD-10-CM | POA: Diagnosis not present

## 2022-12-22 DIAGNOSIS — R030 Elevated blood-pressure reading, without diagnosis of hypertension: Secondary | ICD-10-CM | POA: Diagnosis not present

## 2022-12-22 DIAGNOSIS — G620 Drug-induced polyneuropathy: Secondary | ICD-10-CM | POA: Diagnosis not present

## 2022-12-22 DIAGNOSIS — Z85038 Personal history of other malignant neoplasm of large intestine: Secondary | ICD-10-CM | POA: Diagnosis not present

## 2022-12-22 DIAGNOSIS — F17210 Nicotine dependence, cigarettes, uncomplicated: Secondary | ICD-10-CM | POA: Diagnosis not present

## 2023-01-08 ENCOUNTER — Other Ambulatory Visit: Payer: Self-pay | Admitting: Hematology

## 2023-02-04 ENCOUNTER — Other Ambulatory Visit: Payer: Self-pay | Admitting: *Deleted

## 2023-02-04 MED ORDER — OXYCODONE-ACETAMINOPHEN 7.5-325 MG PO TABS: 1.0000 | ORAL_TABLET | Freq: Three times a day (TID) | ORAL | 0 refills | Status: DC | PRN

## 2023-02-10 DIAGNOSIS — Z933 Colostomy status: Secondary | ICD-10-CM | POA: Diagnosis not present

## 2023-02-24 ENCOUNTER — Inpatient Hospital Stay: Payer: 59 | Attending: Hematology

## 2023-02-24 DIAGNOSIS — Z452 Encounter for adjustment and management of vascular access device: Secondary | ICD-10-CM | POA: Insufficient documentation

## 2023-02-24 DIAGNOSIS — C187 Malignant neoplasm of sigmoid colon: Secondary | ICD-10-CM | POA: Insufficient documentation

## 2023-03-03 ENCOUNTER — Inpatient Hospital Stay: Payer: 59 | Admitting: Hematology

## 2023-03-05 ENCOUNTER — Other Ambulatory Visit: Payer: Self-pay | Admitting: *Deleted

## 2023-03-05 MED ORDER — OXYCODONE-ACETAMINOPHEN 7.5-325 MG PO TABS: 1.0000 | ORAL_TABLET | Freq: Three times a day (TID) | ORAL | 0 refills | Status: DC | PRN

## 2023-03-18 ENCOUNTER — Inpatient Hospital Stay: Payer: 59

## 2023-03-18 DIAGNOSIS — C187 Malignant neoplasm of sigmoid colon: Secondary | ICD-10-CM | POA: Diagnosis not present

## 2023-03-18 DIAGNOSIS — Z452 Encounter for adjustment and management of vascular access device: Secondary | ICD-10-CM | POA: Diagnosis not present

## 2023-03-18 MED ORDER — SODIUM CHLORIDE 0.9% FLUSH
10.0000 mL | Freq: Once | INTRAVENOUS | Status: AC
  Administered 2023-03-18: 10 mL via INTRAVENOUS

## 2023-03-18 MED ORDER — HEPARIN SOD (PORK) LOCK FLUSH 100 UNIT/ML IV SOLN
500.0000 [IU] | Freq: Once | INTRAVENOUS | Status: AC
  Administered 2023-03-18: 500 [IU] via INTRAVENOUS

## 2023-03-18 NOTE — Progress Notes (Signed)
Patient has a double port. Double port accessed and flushed. Both ports flushed without difficulty.  No blood return noted from either port no bruising, pain, discomfort, or swelling noted at site. Gauze applied.  VSS with discharge and left in satisfactory condition with no s/s of distress noted. All follow ups as scheduled.      Selin Eisler [employer]

## 2023-03-25 ENCOUNTER — Inpatient Hospital Stay: Payer: 59

## 2023-03-25 ENCOUNTER — Inpatient Hospital Stay: Payer: 59 | Attending: Hematology | Admitting: Hematology

## 2023-03-25 VITALS — BP 134/85 | HR 98 | Temp 98.6°F | Resp 18 | Ht 68.0 in | Wt 345.6 lb

## 2023-03-25 DIAGNOSIS — G620 Drug-induced polyneuropathy: Secondary | ICD-10-CM | POA: Insufficient documentation

## 2023-03-25 DIAGNOSIS — C187 Malignant neoplasm of sigmoid colon: Secondary | ICD-10-CM | POA: Diagnosis not present

## 2023-03-25 DIAGNOSIS — F17210 Nicotine dependence, cigarettes, uncomplicated: Secondary | ICD-10-CM | POA: Diagnosis not present

## 2023-03-25 DIAGNOSIS — Z79899 Other long term (current) drug therapy: Secondary | ICD-10-CM | POA: Insufficient documentation

## 2023-03-25 DIAGNOSIS — R918 Other nonspecific abnormal finding of lung field: Secondary | ICD-10-CM | POA: Diagnosis not present

## 2023-03-25 DIAGNOSIS — T451X5A Adverse effect of antineoplastic and immunosuppressive drugs, initial encounter: Secondary | ICD-10-CM | POA: Insufficient documentation

## 2023-03-25 LAB — CBC WITH DIFFERENTIAL/PLATELET
Abs Immature Granulocytes: 0.08 10*3/uL — ABNORMAL HIGH (ref 0.00–0.07)
Basophils Absolute: 0 10*3/uL (ref 0.0–0.1)
Basophils Relative: 0 %
Eosinophils Absolute: 0.3 10*3/uL (ref 0.0–0.5)
Eosinophils Relative: 2 %
HCT: 44.3 % (ref 39.0–52.0)
Hemoglobin: 14 g/dL (ref 13.0–17.0)
Immature Granulocytes: 1 %
Lymphocytes Relative: 20 %
Lymphs Abs: 2.5 10*3/uL (ref 0.7–4.0)
MCH: 28.1 pg (ref 26.0–34.0)
MCHC: 31.6 g/dL (ref 30.0–36.0)
MCV: 89 fL (ref 80.0–100.0)
Monocytes Absolute: 0.7 10*3/uL (ref 0.1–1.0)
Monocytes Relative: 5 %
Neutro Abs: 9 10*3/uL — ABNORMAL HIGH (ref 1.7–7.7)
Neutrophils Relative %: 72 %
Platelets: 236 10*3/uL (ref 150–400)
RBC: 4.98 MIL/uL (ref 4.22–5.81)
RDW: 14.2 % (ref 11.5–15.5)
WBC: 12.6 10*3/uL — ABNORMAL HIGH (ref 4.0–10.5)
nRBC: 0 % (ref 0.0–0.2)

## 2023-03-25 LAB — COMPREHENSIVE METABOLIC PANEL
ALT: 31 U/L (ref 0–44)
AST: 27 U/L (ref 15–41)
Albumin: 3.2 g/dL — ABNORMAL LOW (ref 3.5–5.0)
Alkaline Phosphatase: 87 U/L (ref 38–126)
Anion gap: 9 (ref 5–15)
BUN: 9 mg/dL (ref 6–20)
CO2: 29 mmol/L (ref 22–32)
Calcium: 8.9 mg/dL (ref 8.9–10.3)
Chloride: 92 mmol/L — ABNORMAL LOW (ref 98–111)
Creatinine, Ser: 0.8 mg/dL (ref 0.61–1.24)
GFR, Estimated: >60 mL/min (ref >60)
Glucose, Bld: 352 mg/dL — ABNORMAL HIGH (ref 70–99)
Potassium: 4.1 mmol/L (ref 3.5–5.1)
Sodium: 130 mmol/L — ABNORMAL LOW (ref 135–145)
Total Bilirubin: 0.4 mg/dL (ref <1.2)
Total Protein: 6.5 g/dL (ref 6.5–8.1)
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
## 2023-03-25 NOTE — Patient Instructions (Addendum)
[facility] at [facility] Discharge Instructions   You were seen and examined today by Dr. Ellin Saba.  We will get your blood work today on your way out. We will call you with any abnormal results.   We will see you back in 3 months. We will repeat lab work and a CT scan prior to your next visit.   Return as scheduled.    Thank you for choosing [facility] at [facility] to provide your oncology and hematology care.  To afford each patient quality time with our provider, please arrive at least 15 minutes before your scheduled appointment time.   If you have a lab appointment with the Cancer Center please come in thru the Main Entrance and check in at the main information desk.  You need to re-schedule your appointment should you arrive 10 or more minutes late.  We strive to give you quality time with our providers, and arriving late affects you and other patients whose appointments are after yours.  Also, if you no show three or more times for appointments you may be dismissed from the clinic at the providers discretion.     Again, thank you for choosing [name].  Our hope is that these requests will decrease the amount of time that you wait before being seen by our physicians.       _____________________________________________________________  Should you have questions after your visit to [facility], please contact our office at [phone] and follow the prompts.  Our office hours are 8:00 a.m. and 4:30 p.m. Monday - Friday.  Please note that voicemails left after 4:00 p.m. may not be returned until the following business day.  We are closed weekends and major holidays.  You do have access to a nurse 24-7, just call the main number to the clinic [phone] and do not press any options, hold on the line and a nurse will answer the phone.    For prescription refill requests, have your pharmacy contact our office  and allow 72 hours.    Due to Covid, you will need to wear a mask upon entering the hospital. If you do not have a mask, a mask will be given to you at the Main Entrance upon arrival. For doctor visits, patients may have 1 support person age 10 or older with them. For treatment visits, patients can not have anyone with them due to social distancing guidelines and our immunocompromised population.

## 2023-03-26 LAB — CEA: CEA: 8.7 ng/mL — ABNORMAL HIGH (ref 0.0–4.7)

## 2023-04-02 ENCOUNTER — Other Ambulatory Visit: Payer: Self-pay | Admitting: *Deleted

## 2023-04-02 MED ORDER — OXYCODONE-ACETAMINOPHEN 7.5-325 MG PO TABS: 1.0000 | ORAL_TABLET | Freq: Three times a day (TID) | ORAL | 0 refills | Status: DC | PRN

## 2023-04-23 DIAGNOSIS — L02412 Cutaneous abscess of left axilla: Secondary | ICD-10-CM | POA: Diagnosis not present

## 2023-04-23 DIAGNOSIS — Z6841 Body Mass Index (BMI) 40.0 and over, adult: Secondary | ICD-10-CM | POA: Diagnosis not present

## 2023-04-23 DIAGNOSIS — F17210 Nicotine dependence, cigarettes, uncomplicated: Secondary | ICD-10-CM | POA: Diagnosis not present

## 2023-04-23 DIAGNOSIS — E119 Type 2 diabetes mellitus without complications: Secondary | ICD-10-CM | POA: Diagnosis not present

## 2023-04-23 DIAGNOSIS — R030 Elevated blood-pressure reading, without diagnosis of hypertension: Secondary | ICD-10-CM | POA: Diagnosis not present

## 2023-04-24 ENCOUNTER — Telehealth: Payer: Self-pay | Admitting: *Deleted

## 2023-04-24 NOTE — Telephone Encounter (Signed)
Received referral from [facility] for large abscess under left axilla (10 x 12 CM). Faxed notes did not mention if abscess required I&D.   Call placed to the PCP office. Message was sent to provider to inquire, but no response was given.   Call placed to patient. Patient states that area is hard and exquisitely tender. Advised to go to ER for evaluation and treatment. Advised that we can see patient after ER visit. Patient states that he will call back if he wishes to proceed with referral.

## 2023-04-25 ENCOUNTER — Other Ambulatory Visit: Payer: Self-pay

## 2023-04-25 ENCOUNTER — Encounter (HOSPITAL_COMMUNITY): Payer: Self-pay | Admitting: *Deleted

## 2023-04-25 ENCOUNTER — Emergency Department (HOSPITAL_COMMUNITY): Payer: 59

## 2023-04-25 ENCOUNTER — Inpatient Hospital Stay (HOSPITAL_COMMUNITY)
Admission: EM | Admit: 2023-04-25 | Discharge: 2023-04-29 | DRG: 872 | Disposition: A | Discharge status: Home Health | Payer: 59 | Attending: Internal Medicine | Admitting: Internal Medicine

## 2023-04-25 DIAGNOSIS — R6520 Severe sepsis without septic shock: Secondary | ICD-10-CM | POA: Diagnosis not present

## 2023-04-25 DIAGNOSIS — L02412 Cutaneous abscess of left axilla: Secondary | ICD-10-CM | POA: Diagnosis not present

## 2023-04-25 DIAGNOSIS — Z9221 Personal history of antineoplastic chemotherapy: Secondary | ICD-10-CM

## 2023-04-25 DIAGNOSIS — Z7984 Long term (current) use of oral hypoglycemic drugs: Secondary | ICD-10-CM | POA: Diagnosis not present

## 2023-04-25 DIAGNOSIS — R739 Hyperglycemia, unspecified: Secondary | ICD-10-CM | POA: Diagnosis present

## 2023-04-25 DIAGNOSIS — Z6841 Body Mass Index (BMI) 40.0 and over, adult: Secondary | ICD-10-CM

## 2023-04-25 DIAGNOSIS — A419 Sepsis, unspecified organism: Principal | ICD-10-CM | POA: Diagnosis present

## 2023-04-25 DIAGNOSIS — C187 Malignant neoplasm of sigmoid colon: Secondary | ICD-10-CM | POA: Diagnosis present

## 2023-04-25 DIAGNOSIS — Z933 Colostomy status: Secondary | ICD-10-CM

## 2023-04-25 DIAGNOSIS — K435 Parastomal hernia without obstruction or  gangrene: Secondary | ICD-10-CM | POA: Diagnosis present

## 2023-04-25 DIAGNOSIS — J432 Centrilobular emphysema: Secondary | ICD-10-CM | POA: Diagnosis not present

## 2023-04-25 DIAGNOSIS — G6281 Critical illness polyneuropathy: Secondary | ICD-10-CM | POA: Diagnosis not present

## 2023-04-25 DIAGNOSIS — Z79899 Other long term (current) drug therapy: Secondary | ICD-10-CM

## 2023-04-25 DIAGNOSIS — I10 Essential (primary) hypertension: Secondary | ICD-10-CM | POA: Diagnosis not present

## 2023-04-25 DIAGNOSIS — F17210 Nicotine dependence, cigarettes, uncomplicated: Secondary | ICD-10-CM | POA: Diagnosis not present

## 2023-04-25 DIAGNOSIS — E872 Acidosis, unspecified: Secondary | ICD-10-CM | POA: Diagnosis not present

## 2023-04-25 DIAGNOSIS — C189 Malignant neoplasm of colon, unspecified: Secondary | ICD-10-CM | POA: Diagnosis not present

## 2023-04-25 DIAGNOSIS — R918 Other nonspecific abnormal finding of lung field: Secondary | ICD-10-CM | POA: Diagnosis not present

## 2023-04-25 DIAGNOSIS — G63 Polyneuropathy in diseases classified elsewhere: Secondary | ICD-10-CM | POA: Diagnosis not present

## 2023-04-25 DIAGNOSIS — E1165 Type 2 diabetes mellitus with hyperglycemia: Secondary | ICD-10-CM | POA: Diagnosis not present

## 2023-04-25 DIAGNOSIS — Z95828 Presence of other vascular implants and grafts: Secondary | ICD-10-CM

## 2023-04-25 DIAGNOSIS — Z85038 Personal history of other malignant neoplasm of large intestine: Secondary | ICD-10-CM

## 2023-04-25 DIAGNOSIS — E1140 Type 2 diabetes mellitus with diabetic neuropathy, unspecified: Secondary | ICD-10-CM | POA: Diagnosis present

## 2023-04-25 DIAGNOSIS — C801 Malignant (primary) neoplasm, unspecified: Secondary | ICD-10-CM | POA: Diagnosis not present

## 2023-04-25 DIAGNOSIS — Z9049 Acquired absence of other specified parts of digestive tract: Secondary | ICD-10-CM

## 2023-04-25 DIAGNOSIS — E11628 Type 2 diabetes mellitus with other skin complications: Secondary | ICD-10-CM | POA: Diagnosis not present

## 2023-04-25 DIAGNOSIS — A4102 Sepsis due to Methicillin resistant Staphylococcus aureus: Secondary | ICD-10-CM | POA: Diagnosis not present

## 2023-04-25 LAB — CBC
HCT: 39.2 % (ref 39.0–52.0)
Hemoglobin: 12.6 g/dL — ABNORMAL LOW (ref 13.0–17.0)
MCH: 28.3 pg (ref 26.0–34.0)
MCHC: 32.1 g/dL (ref 30.0–36.0)
MCV: 87.9 fL (ref 80.0–100.0)
Platelets: 279 10*3/uL (ref 150–400)
RBC: 4.46 MIL/uL (ref 4.22–5.81)
RDW: 14.2 % (ref 11.5–15.5)
WBC: 17.5 10*3/uL — ABNORMAL HIGH (ref 4.0–10.5)
nRBC: 0 % (ref 0.0–0.2)

## 2023-04-25 LAB — GLUCOSE, CAPILLARY
Glucose-Capillary: 302 mg/dL — ABNORMAL HIGH (ref 70–99)
Glucose-Capillary: 289 mg/dL — ABNORMAL HIGH (ref 70–99)
Glucose-Capillary: 325 mg/dL — ABNORMAL HIGH (ref 70–99)

## 2023-04-25 LAB — CBC WITH DIFFERENTIAL/PLATELET
Abs Immature Granulocytes: 0.2 10*3/uL — ABNORMAL HIGH (ref 0.00–0.07)
Basophils Absolute: 0.1 10*3/uL (ref 0.0–0.1)
Basophils Relative: 0 %
Eosinophils Absolute: 0.2 10*3/uL (ref 0.0–0.5)
Eosinophils Relative: 1 %
HCT: 40.4 % (ref 39.0–52.0)
Hemoglobin: 13 g/dL (ref 13.0–17.0)
Immature Granulocytes: 1 %
Lymphocytes Relative: 12 %
Lymphs Abs: 1.9 10*3/uL (ref 0.7–4.0)
MCH: 28.3 pg (ref 26.0–34.0)
MCHC: 32.2 g/dL (ref 30.0–36.0)
MCV: 88 fL (ref 80.0–100.0)
Monocytes Absolute: 0.8 10*3/uL (ref 0.1–1.0)
Monocytes Relative: 5 %
Neutro Abs: 12.5 10*3/uL — ABNORMAL HIGH (ref 1.7–7.7)
Neutrophils Relative %: 81 %
Platelets: 255 10*3/uL (ref 150–400)
RBC: 4.59 MIL/uL (ref 4.22–5.81)
RDW: 14.3 % (ref 11.5–15.5)
WBC: 15.6 10*3/uL — ABNORMAL HIGH (ref 4.0–10.5)
nRBC: 0 % (ref 0.0–0.2)

## 2023-04-25 LAB — COMPREHENSIVE METABOLIC PANEL
ALT: 19 U/L (ref 0–44)
AST: 19 U/L (ref 15–41)
Albumin: 2.6 g/dL — ABNORMAL LOW (ref 3.5–5.0)
Alkaline Phosphatase: 101 U/L (ref 38–126)
Anion gap: 11 (ref 5–15)
BUN: 9 mg/dL (ref 6–20)
CO2: 26 mmol/L (ref 22–32)
Calcium: 8.6 mg/dL — ABNORMAL LOW (ref 8.9–10.3)
Chloride: 90 mmol/L — ABNORMAL LOW (ref 98–111)
Creatinine, Ser: 0.71 mg/dL (ref 0.61–1.24)
GFR, Estimated: >60 mL/min (ref >60)
Glucose, Bld: 642 mg/dL (ref 70–99)
Potassium: 4.2 mmol/L (ref 3.5–5.1)
Sodium: 127 mmol/L — ABNORMAL LOW (ref 135–145)
Total Bilirubin: 0.3 mg/dL (ref <1.2)
Total Protein: 6.3 g/dL — ABNORMAL LOW (ref 6.5–8.1)

## 2023-04-25 LAB — BLOOD GAS, VENOUS
Acid-Base Excess: 5.1 mmol/L — ABNORMAL HIGH (ref 0.0–2.0)
Bicarbonate: 32.2 mmol/L — ABNORMAL HIGH (ref 20.0–28.0)
O2 Saturation: 63 %
Patient temperature: 36.5
pCO2, Ven: 56 mm[Hg] (ref 44–60)
pH, Ven: 7.37 (ref 7.25–7.43)
pO2, Ven: 34 mm[Hg] (ref 32–45)

## 2023-04-25 LAB — PROTIME-INR
INR: 1.1 (ref 0.8–1.2)
Prothrombin Time: 14.3 s (ref 11.4–15.2)

## 2023-04-25 LAB — HEMOGLOBIN A1C
Hgb A1c MFr Bld: 14.7 % — ABNORMAL HIGH (ref 4.8–5.6)
Mean Plasma Glucose: 375.19 mg/dL

## 2023-04-25 LAB — BETA-HYDROXYBUTYRIC ACID: Beta-Hydroxybutyric Acid: 0.07 mmol/L (ref 0.05–0.27)

## 2023-04-25 LAB — LACTIC ACID, PLASMA: Lactic Acid, Venous: 3.1 mmol/L (ref 0.5–1.9)

## 2023-04-25 LAB — HIV ANTIBODY (ROUTINE TESTING W REFLEX): HIV Screen 4th Generation wRfx: NONREACTIVE

## 2023-04-25 LAB — CBG MONITORING, ED
Glucose-Capillary: 440 mg/dL — ABNORMAL HIGH (ref 70–99)
Glucose-Capillary: 314 mg/dL — ABNORMAL HIGH (ref 70–99)

## 2023-04-25 MED ORDER — INSULIN GLARGINE-YFGN 100 UNIT/ML ~~LOC~~ SOLN
8.0000 [IU] | Freq: Every day | SUBCUTANEOUS | Status: DC
  Administered 2023-04-25 – 2023-04-26 (×2): 8 [IU] via SUBCUTANEOUS
  Filled 2023-04-25 (×3): qty 0.08

## 2023-04-25 MED ORDER — ACETAMINOPHEN 325 MG PO TABS: 650.0000 mg | ORAL_TABLET | Freq: Four times a day (QID) | ORAL | Status: DC | PRN

## 2023-04-25 MED ORDER — HYDROMORPHONE HCL 1 MG/ML IJ SOLN
1.0000 mg | Freq: Once | INTRAMUSCULAR | Status: AC
  Administered 2023-04-25: 1 mg via INTRAVENOUS
  Filled 2023-04-25: qty 1

## 2023-04-25 MED ORDER — LACTATED RINGERS IV SOLN: INTRAVENOUS | Status: AC

## 2023-04-25 MED ORDER — OXYCODONE HCL 5 MG PO TABS
5.0000 mg | ORAL_TABLET | ORAL | Status: DC | PRN
  Administered 2023-04-26 – 2023-04-27 (×2): 5 mg via ORAL
  Filled 2023-04-25 (×2): qty 1

## 2023-04-25 MED ORDER — INSULIN ASPART 100 UNIT/ML IJ SOLN
10.0000 [IU] | Freq: Once | INTRAMUSCULAR | Status: AC
  Administered 2023-04-25: 10 [IU] via INTRAVENOUS
  Filled 2023-04-25: qty 1

## 2023-04-25 MED ORDER — VANCOMYCIN HCL IN DEXTROSE 1-5 GM/200ML-% IV SOLN
1000.0000 mg | INTRAVENOUS | Status: AC
  Administered 2023-04-25 (×2): 1000 mg via INTRAVENOUS
  Filled 2023-04-25 (×2): qty 200

## 2023-04-25 MED ORDER — ONDANSETRON HCL 4 MG PO TABS: 4.0000 mg | ORAL_TABLET | Freq: Four times a day (QID) | ORAL | Status: DC | PRN

## 2023-04-25 MED ORDER — INSULIN ASPART 100 UNIT/ML IJ SOLN
0.0000 [IU] | Freq: Every day | INTRAMUSCULAR | Status: DC
  Administered 2023-04-25: 4 [IU] via SUBCUTANEOUS
  Administered 2023-04-26: 3 [IU] via SUBCUTANEOUS
  Administered 2023-04-27 – 2023-04-28 (×2): 2 [IU] via SUBCUTANEOUS

## 2023-04-25 MED ORDER — ACETAMINOPHEN 650 MG RE SUPP: 650.0000 mg | Freq: Four times a day (QID) | RECTAL | Status: DC | PRN

## 2023-04-25 MED ORDER — MORPHINE SULFATE (PF) 4 MG/ML IV SOLN
6.0000 mg | Freq: Once | INTRAVENOUS | Status: AC
  Administered 2023-04-25: 6 mg via INTRAVENOUS
  Filled 2023-04-25: qty 2

## 2023-04-25 MED ORDER — SODIUM CHLORIDE 0.9 % IV BOLUS
2000.0000 mL | Freq: Once | INTRAVENOUS | Status: AC
  Administered 2023-04-25: 2000 mL via INTRAVENOUS

## 2023-04-25 MED ORDER — VANCOMYCIN HCL IN DEXTROSE 1-5 GM/200ML-% IV SOLN: 1000.0000 mg | Freq: Once | INTRAVENOUS | Status: DC

## 2023-04-25 MED ORDER — INSULIN ASPART 100 UNIT/ML IJ SOLN
5.0000 [IU] | Freq: Once | INTRAMUSCULAR | Status: AC
  Administered 2023-04-25: 5 [IU] via SUBCUTANEOUS
  Filled 2023-04-25: qty 1

## 2023-04-25 MED ORDER — INSULIN ASPART 100 UNIT/ML IJ SOLN
0.0000 [IU] | Freq: Three times a day (TID) | INTRAMUSCULAR | Status: DC
  Administered 2023-04-25: 15 [IU] via SUBCUTANEOUS
  Administered 2023-04-26: 11 [IU] via SUBCUTANEOUS
  Administered 2023-04-26: 15 [IU] via SUBCUTANEOUS
  Administered 2023-04-26: 11 [IU] via SUBCUTANEOUS
  Administered 2023-04-27 (×2): 7 [IU] via SUBCUTANEOUS
  Administered 2023-04-27: 20 [IU] via SUBCUTANEOUS
  Administered 2023-04-28: 7 [IU] via SUBCUTANEOUS
  Administered 2023-04-28: 11 [IU] via SUBCUTANEOUS
  Administered 2023-04-28 – 2023-04-29 (×2): 7 [IU] via SUBCUTANEOUS

## 2023-04-25 MED ORDER — LIDOCAINE-EPINEPHRINE (PF) 2 %-1:200000 IJ SOLN
10.0000 mL | Freq: Once | INTRAMUSCULAR | Status: AC
  Administered 2023-04-25: 10 mL via INTRADERMAL
  Filled 2023-04-25: qty 20

## 2023-04-25 MED ORDER — HYDRALAZINE HCL 20 MG/ML IJ SOLN: 5.0000 mg | INTRAMUSCULAR | Status: DC | PRN

## 2023-04-25 MED ORDER — ONDANSETRON HCL 4 MG/2ML IJ SOLN
4.0000 mg | Freq: Four times a day (QID) | INTRAMUSCULAR | Status: DC | PRN
  Administered 2023-04-27: 4 mg via INTRAVENOUS

## 2023-04-25 MED ORDER — HYDROMORPHONE HCL 1 MG/ML IJ SOLN
0.5000 mg | INTRAMUSCULAR | Status: DC | PRN
  Administered 2023-04-26: 1 mg via INTRAVENOUS
  Administered 2023-04-26: 0.5 mg via INTRAVENOUS
  Administered 2023-04-28: 1 mg via INTRAVENOUS
  Filled 2023-04-25 (×4): qty 1

## 2023-04-25 MED ORDER — VANCOMYCIN HCL 1750 MG/350ML IV SOLN
1750.0000 mg | Freq: Two times a day (BID) | INTRAVENOUS | Status: DC
  Administered 2023-04-26 – 2023-04-28 (×5): 1750 mg via INTRAVENOUS
  Filled 2023-04-25 (×9): qty 350

## 2023-04-25 MED ORDER — IOHEXOL 300 MG/ML  SOLN
80.0000 mL | Freq: Once | INTRAMUSCULAR | Status: AC | PRN
  Administered 2023-04-25: 80 mL via INTRAVENOUS

## 2023-04-25 NOTE — ED Notes (Signed)
Noted squirting blood Informed PA PA packed wound Admitting MD at bedside Quick Clot placed in wound Pressure held Once bleeding was controlled, MD packed with quick clot, pressure dressing placed Vitals assessed pain 8/10 Size of abscess is decreased to about half 650cc of blood and puss in suction canister

## 2023-04-25 NOTE — ED Notes (Signed)
Glucose 642 Informed PA

## 2023-04-25 NOTE — ED Notes (Signed)
Introduced self to pt PT has hx of colon CA Has colostomy bag and implanted port in chest  Pt has a softball size abscess under LEFT armpit Pt stated he was seen at PCP x2 and recd different rounds of ABX and it is getting worse.   Undressed pt Attached to partial monitor Orders placed in system
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
## 2023-04-25 NOTE — ED Notes (Signed)
Pt appears more relaxed at this time Pt stated pain is still 8/10 Wife at bedside Dressing checked, clean and dry All bleeding controlled

## 2023-04-25 NOTE — ED Provider Notes (Cosign Needed Addendum)
[place] EMERGENCY DEPARTMENT AT [facility] Provider Note   CSN: [id number] Arrival date & time: 04/25/23  1059     History  Chief Complaint  Patient presents with   Abscess    Jose Melendez is a 52 y.o. male.  Patient with history of colon CA, s/p colostomy, T2DM on metformin only, presents to the ED with worsening abscess in the left axilla that started over a week ago. He was seen at Urgent care and started on Septra with a referral to surgery. His wife reports he followed up with surgery and was referred to the ED. He denies fever but he reports night sweats last night. No drainage from the area. No vomiting.   The history is provided by the patient. No language interpreter was used.  Abscess      Home Medications Prior to Admission medications   Medication Sig Start Date End Date Taking? Authorizing Provider  metFORMIN (GLUCOPHAGE-XR) 500 MG 24 hr tablet Take 500 mg by mouth daily with breakfast.   Yes [provider]  oxyCODONE-acetaminophen (PERCOCET) 7.5-325 MG tablet Take 1 tablet by mouth every 8 (eight) hours as needed for severe pain (pain score 7-10). 04/02/23  Yes Doreatha Massed, MD  sulfamethoxazole-trimethoprim (BACTRIM DS) 800-160 MG tablet Take 2 tablets by mouth 2 (two) times daily. 04/23/23  Yes [provider]      Allergies    Patient has no known allergies.    Review of Systems   Review of Systems  Physical Exam Updated Vital Signs BP (!) 143/85 (BP Location: Left Wrist)   Pulse 96   Temp 98.2 F (36.8 C) (Oral)   Resp 20   Ht 5\' 8"  (1.727 m)   Wt (!) 151 kg   SpO2 92%   BMI 50.63 kg/m  Physical Exam Vitals and nursing note reviewed.  Constitutional:      Appearance: Normal appearance. He is obese.  Cardiovascular:     Rate and Rhythm: Normal rate.  Pulmonary:     Breath sounds: No wheezing, rhonchi or rales.  Chest:     Chest wall: No tenderness.  Abdominal:     Comments: Colostomy in the LLQ  abdomen.   Musculoskeletal:     Cervical back: Normal range of motion and neck supple.     Comments: Large area of induration left axilla measuring 10 cm x 14 cm with surrounding erythema. No drainage.   Skin:    General: Skin is warm and dry.     Findings: Erythema present.  Neurological:     Mental Status: He is alert and oriented to person, place, and time.     ED Results / Procedures / Treatments   Labs (all labs ordered are listed, but only abnormal results are displayed) Labs Reviewed  CBC WITH DIFFERENTIAL/PLATELET - Abnormal; Notable for the following components:      Result Value   WBC 15.6 (*)    Neutro Abs 12.5 (*)    Abs Immature Granulocytes 0.20 (*)    All other components within normal limits  COMPREHENSIVE METABOLIC PANEL - Abnormal; Notable for the following components:   Sodium 127 (*)    Chloride 90 (*)    Glucose, Bld 642 (*)    Calcium 8.6 (*)    Total Protein 6.3 (*)    Albumin 2.6 (*)    All other components within normal limits  LACTIC ACID, PLASMA - Abnormal; Notable for the following components:   Lactic Acid, Venous 3.1 (*)  All other components within normal limits  BLOOD GAS, VENOUS - Abnormal; Notable for the following components:   Bicarbonate 32.2 (*)    Acid-Base Excess 5.1 (*)    All other components within normal limits  CBG MONITORING, ED - Abnormal; Notable for the following components:   Glucose-Capillary 440 (*)    All other components within normal limits  CULTURE, BLOOD (ROUTINE X 2)  CULTURE, BLOOD (ROUTINE X 2)  AEROBIC/ANAEROBIC CULTURE W GRAM STAIN (SURGICAL/DEEP WOUND)  BETA-HYDROXYBUTYRIC ACID  PROTIME-INR  HEMOGLOBIN A1C  PROTIME-INR    EKG None  Radiology CT Chest W Contrast  Result Date: 04/25/2023 CLINICAL DATA:  Colon cancer, assess treatment response. History of pulmonary nodule suspicious for metastatic disease. Large abscess under arm. * Tracking Code: BO * EXAM: CT CHEST WITH CONTRAST TECHNIQUE:  Multidetector CT imaging of the chest was performed during intravenous contrast administration. RADIATION DOSE REDUCTION: This exam was performed according to the departmental dose-optimization program which includes automated exposure control, adjustment of the mA and/or kV according to patient size and/or use of iterative reconstruction technique. CONTRAST:  80mL OMNIPAQUE IOHEXOL 300 MG/ML  SOLN COMPARISON:  Chest CT 11/21/2022. No other relevant comparison studies are available for direct correlation. Report from PET-CT 04/07/2022, images unavailable. FINDINGS: Cardiovascular: No acute vascular findings are identified. Right IJ Port-A-Cath extends to the lower SVC. There is mild coronary artery atherosclerosis. The heart size is normal. There is no pericardial effusion. Mediastinum/Nodes: There is a new large multilobulated mass or complex fluid collection within the left axilla, measuring approximately 12.6 x 7.4 x 10.6 cm. This extends into the lateral aspect of the breast and is incompletely visualized. No associated calcifications or surrounding soft tissue emphysema. Apart from this mass, no enlarged lymph nodes are identified within the mediastinum, hilar or axillary regions. The thyroid gland, trachea and esophagus demonstrate no significant findings. Lungs/Pleura: No pleural effusion or pneumothorax. There is mild centrilobular emphysema. Scattered small pulmonary nodules are similar to the previous CT, largest in the lingula, measuring 9 x 5 mm on image 105/4. This nodule appears triangular on the reformatted images and may be associated with an accessory fissure. The morphology of this nodule is benign. A 5 mm left upper lobe nodule on image 88/4 appears slightly larger. No other enlarging or new nodules are identified. Upper abdomen: Diffuse hepatic steatosis and probable hepatomegaly. No focal abnormalities are identified. Musculoskeletal/Chest wall: No acute osseous findings or evidence of osseous  metastatic disease. Mild paraspinal osteophytes. As above, incompletely visualized left axillary mass or complex fluid collection. IMPRESSION: 1. Similar appearance of scattered small pulmonary nodules compared with available prior chest CT from 5 months ago. Based on prior reports, these may reflect treated metastatic disease. A single left upper lobe nodule measuring 5 mm appears slightly larger. No other significant change identified. 2. New large multilobulated mass or complex fluid collection within the left axilla, incompletely visualized. This is new from July and is compatible with the given history of abscess. Correlate clinically. This could be further evaluated with ultrasound to assess for drainable components, if clinically warranted. 3. No other evidence of metastatic disease within the chest. 4. Diffuse hepatic steatosis and probable hepatomegaly. 5. Aortic Atherosclerosis (ICD10-I70.0) and Emphysema (ICD10-J43.9). Electronically Signed   By: Carey Bullocks M.D.   On: 04/25/2023 13:35    Procedures .Incision and Drainage  Date/Time: 04/25/2023 3:36 PM  Performed by: Elpidio Anis, PA-C Authorized by: Elpidio Anis, PA-C   Consent:    Consent obtained:  Verbal  Universal protocol:    Procedure explained and questions answered to patient or proxy's satisfaction: yes     Patient identity confirmed:  Verbally with patient Location:    Type:  Abscess   Location:  Upper extremity   Upper extremity location:  Arm   Arm location:  L upper arm (Left axilla) Procedure type:    Complexity:  Complex Procedure details:    Incision types:  Single straight   Wound management:  Probed and deloculated   Drainage:  Bloody and purulent   Drainage amount:  Copious (600 cc)   Packing material: Quick clot gauze. Post-procedure details:    Procedure completion:  Tolerated     Medications Ordered in ED Medications  insulin aspart (novoLOG) injection 5 Units (has no administration in time  range)  lactated ringers infusion (has no administration in time range)  sodium chloride 0.9 % bolus 2,000 mL (0 mLs Intravenous Stopped 04/25/23 1534)  vancomycin (VANCOCIN) IVPB 1000 mg/200 mL premix (1,000 mg Intravenous New Bag/Given 04/25/23 1436)  iohexol (OMNIPAQUE) 300 MG/ML solution 80 mL (80 mLs Intravenous Contrast Given 04/25/23 1254)  insulin aspart (novoLOG) injection 10 Units (10 Units Intravenous Given 04/25/23 1332)  morphine (PF) 4 MG/ML injection 6 mg (6 mg Intravenous Given 04/25/23 1401)  lidocaine-EPINEPHrine (XYLOCAINE W/EPI) 2 %-1:200000 (PF) injection 10 mL (10 mLs Intradermal Given by Other 04/25/23 1439)  HYDROmorphone (DILAUDID) injection 1 mg (1 mg Intravenous Given 04/25/23 1442)    ED Course/ Medical Decision Making/ A&P Clinical Course as of 04/25/23 1539  Sat Apr 25, 2023  1533 I was called to the room for bleeding from the I&D site. There is a venous bleeder visualized. Wound was packed with Quick-clot gauze which appears to be controlling active bleeding. Will continue with repeat exams. Dr. Randol Kern has accepted the patient for admission.  [SU]    Clinical Course User Index [SU] Elpidio Anis, PA-C                                 Medical Decision Making This patient presents to the ED for concern of painful axillary swelling, this involves an extensive number of treatment options, and is a complaint that carries with it a high risk of complications and morbidity.  The differential diagnosis includes abscess vs sepsis vs mass   Co morbidities that complicate the patient evaluation  Obese, h/o CA, T2DM   Additional history obtained:  Additional history and/or information obtained from chart review, notable for Oncology - last note 03/25/23   Lab Tests:  I Ordered, and personally interpreted labs.  The pertinent results include:  Na 127, Cl 90, Glucose 643, lactic acid 3.1, WBC 15    Imaging Studies ordered:  I ordered imaging studies including CT  chest Radiologist interpretation - large loculated mass left axilla c/w abscess   Medicines ordered and prescription drug management:  I ordered medication including Morphine  for pain Reevaluation of the patient after these medicines showed that the patient improved I have reviewed the patients home medicines and have made adjustments as needed   Test Considered:  Surgical consult   Critical Interventions:  IV antibiotics - Vancomycin Insulin - 10 U IV (CBG 642)   Consultations Obtained:  I requested consultation with the Dr. Algis Greenhouse, surgery,  and discussed lab and imaging findings as well as pertinent plan - they recommend: She will provide consultation while inpatient. Advises that I should I&D  the abscess and let her know total amount of drainage   Problem List / ED Course:  Progressively worsening abscess left axilla for 1 week. On septra without improvement.  In ED, CBG found significantly elevated to 642. Beta hydroxy normal at 0.07. Insulin 10 U IV given, 2 liters of NS bolus. CBG recheck 440. Will provide another 5 U insulin.   Reevaluation:  After the interventions noted above, I reevaluated the patient and found that they have :improved   Social Determinants of Health:  Supportive family   Disposition:  After consideration of the diagnostic results and the patients response to treatment, I feel that the patient would benefit from Admit for control of his blood sugar, continued antibiotics and surgical consult.   Amount and/or Complexity of Data Reviewed Labs: ordered. Radiology: ordered.  Risk Prescription drug management. Decision regarding hospitalization.           Final Clinical Impression(s) / ED Diagnoses Final diagnoses:  Abscess of axilla, left  Hyperglycemia    Rx / DC Orders ED Discharge Orders     None         Elpidio Anis, PA-C 04/25/23 1511    Elpidio Anis, PA-C 04/25/23 1539    Vanetta Mulders, MD 04/26/23 [phone]

## 2023-04-25 NOTE — Progress Notes (Signed)
Pharmacy Antibiotic Note  Jose Melendez is a 52 y.o. male admitted on 04/25/2023 with sepsis. Patient presenting with worsening abscess to left axilla for approximately one week. PMH significant for morbid obesity, diabetes mellitus, type II, adenocarcinoma stage II, s/p resection, chemotherapy and with colostomy pulmonary nodules. In ED, patient is afebrile with WBC 15.6. Pharmacy has been consulted for vancomycin dosing.  Plan: Given vancomycin load of 2000 mg IV in the ED Start vancomycin 1750 mg IV every 12 hours (eAUC 497.9, Cmin 13.6, Scr 0.8, IBW, Vd 0.5 L/kg) Monitor renal function, clinical status, culture data, and LOT  Height: 5\' 8"  (172.7 cm) Weight: (!) 151 kg (333 lb) IBW/kg (Calculated) : 68.4  Temp (24hrs), Avg:98.4 F (36.9 C), Min:98.2 F (36.8 C), Max:98.6 F (37 C)  Recent Labs  Lab 04/25/23 1144  WBC 15.6*  CREATININE 0.71  LATICACIDVEN 3.1*    Estimated Creatinine Clearance: 158.4 mL/min (by C-G formula based on SCr of 0.71 mg/dL).    No Known Allergies  Antimicrobials this admission: vancomycin 12/7 >>   Dose adjustments this admission: N/A  Microbiology results: 12/7 BCx: pending 12/7 Abscess Cx: pending  Thank you for involving pharmacy in this patient's care.   Rockwell Alexandria, PharmD Clinical Pharmacist 04/25/2023 4:34 PM

## 2023-04-25 NOTE — ED Triage Notes (Signed)
Pt with abscess to left axillary for about a week, has seen UC over a week ago and PCP on Wednesday.  Denies any drainage. Pt was placed on antibiotics and currently taking. Pt states he woke up last night with sweating last night.

## 2023-04-25 NOTE — ED Notes (Signed)
No inulin syringes in ED [facility] aware and going to find some to medicate pt

## 2023-04-25 NOTE — ED Notes (Signed)
Called lab, had IRN added, running on the blood collected earlier in the shift

## 2023-04-26 DIAGNOSIS — R739 Hyperglycemia, unspecified: Secondary | ICD-10-CM | POA: Diagnosis not present

## 2023-04-26 DIAGNOSIS — A419 Sepsis, unspecified organism: Secondary | ICD-10-CM

## 2023-04-26 DIAGNOSIS — L02412 Cutaneous abscess of left axilla: Secondary | ICD-10-CM | POA: Diagnosis not present

## 2023-04-26 DIAGNOSIS — C187 Malignant neoplasm of sigmoid colon: Secondary | ICD-10-CM

## 2023-04-26 DIAGNOSIS — G63 Polyneuropathy in diseases classified elsewhere: Secondary | ICD-10-CM

## 2023-04-26 DIAGNOSIS — C801 Malignant (primary) neoplasm, unspecified: Secondary | ICD-10-CM | POA: Diagnosis not present

## 2023-04-26 LAB — GLUCOSE, CAPILLARY
Glucose-Capillary: 245 mg/dL — ABNORMAL HIGH (ref 70–99)
Glucose-Capillary: 261 mg/dL — ABNORMAL HIGH (ref 70–99)
Glucose-Capillary: 267 mg/dL — ABNORMAL HIGH (ref 70–99)
Glucose-Capillary: 275 mg/dL — ABNORMAL HIGH (ref 70–99)
Glucose-Capillary: 278 mg/dL — ABNORMAL HIGH (ref 70–99)
Glucose-Capillary: 295 mg/dL — ABNORMAL HIGH (ref 70–99)
Glucose-Capillary: 300 mg/dL — ABNORMAL HIGH (ref 70–99)
Glucose-Capillary: 300 mg/dL — ABNORMAL HIGH (ref 70–99)
Glucose-Capillary: 344 mg/dL — ABNORMAL HIGH (ref 70–99)

## 2023-04-26 LAB — BASIC METABOLIC PANEL
Anion gap: 9 (ref 5–15)
BUN: 8 mg/dL (ref 6–20)
CO2: 26 mmol/L (ref 22–32)
Calcium: 7.9 mg/dL — ABNORMAL LOW (ref 8.9–10.3)
Chloride: 94 mmol/L — ABNORMAL LOW (ref 98–111)
Creatinine, Ser: 0.67 mg/dL (ref 0.61–1.24)
GFR, Estimated: >60 mL/min (ref >60)
Glucose, Bld: 274 mg/dL — ABNORMAL HIGH (ref 70–99)
Potassium: 3.7 mmol/L (ref 3.5–5.1)
Sodium: 129 mmol/L — ABNORMAL LOW (ref 135–145)

## 2023-04-26 LAB — CBC
HCT: 38 % — ABNORMAL LOW (ref 39.0–52.0)
Hemoglobin: 11.8 g/dL — ABNORMAL LOW (ref 13.0–17.0)
MCH: 27.5 pg (ref 26.0–34.0)
MCHC: 31.1 g/dL (ref 30.0–36.0)
MCV: 88.6 fL (ref 80.0–100.0)
Platelets: 265 10*3/uL (ref 150–400)
RBC: 4.29 MIL/uL (ref 4.22–5.81)
RDW: 14.2 % (ref 11.5–15.5)
WBC: 15.4 10*3/uL — ABNORMAL HIGH (ref 4.0–10.5)
nRBC: 0 % (ref 0.0–0.2)

## 2023-04-26 LAB — HIV ANTIBODY (ROUTINE TESTING W REFLEX): HIV Screen 4th Generation wRfx: NONREACTIVE

## 2023-04-26 MED ORDER — INSULIN GLARGINE-YFGN 100 UNIT/ML ~~LOC~~ SOLN
12.0000 [IU] | Freq: Every day | SUBCUTANEOUS | Status: DC
  Administered 2023-04-27 – 2023-04-28 (×2): 12 [IU] via SUBCUTANEOUS
  Filled 2023-04-26 (×4): qty 0.12

## 2023-04-26 MED ORDER — CHLORHEXIDINE GLUCONATE CLOTH 2 % EX PADS
6.0000 | MEDICATED_PAD | Freq: Once | CUTANEOUS | Status: AC
Start: 2023-04-26 — End: 2023-04-26
  Administered 2023-04-26: 6 via TOPICAL

## 2023-04-26 MED ORDER — CHLORHEXIDINE GLUCONATE CLOTH 2 % EX PADS
6.0000 | MEDICATED_PAD | Freq: Once | CUTANEOUS | Status: AC
  Administered 2023-04-26: 6 via TOPICAL

## 2023-04-26 NOTE — Progress Notes (Signed)
   04/26/23 1741  TOC Brief Assessment  Insurance and Status Reviewed  Patient has primary care physician Yes  Home environment has been reviewed From home, significant other  Prior level of function: Independent  Prior/Current Home Services No current home services  Social Determinants of Health Reivew SDOH reviewed no interventions necessary  Readmission risk has been reviewed Yes  Transition of care needs no transition of care needs at this time     Transition of Care Department [facility]) has reviewed patient and no TOC needs have been identified at this time. We will continue to monitor patient advancement through interdisciplinary progression rounds. If new patient transition needs arise, please place a TOC consult.
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
## 2023-04-26 NOTE — Progress Notes (Signed)
Progress Note   Patient: Jose Melendez ZOX:[id number] DOB: 09-17-1970 DOA: 04/25/2023     1 DOS: the patient was seen and examined on 04/26/2023   Brief hospital admission course: As per H&P written by Dr. Randol Kern on 04/25/2023. Emma Dellaporta  is a 52 y.o. male, with past medical history of morbid obesity, diabetes mellitus, type II, adenocarcinoma stage II, s/p resection, chemotherapy and with colostomy pulmonary nodules, he is following with Dr. Ellin Saba. -Presents to ED secondary to worsening abscess in the left axilla, reports abscess started a week ago, he went to urgent care where he was started on Cipro, and had referral to surgery, reports he did follow-up with surgery and they referred him to ED, patient reports some chills, fever at home, chest pain, no shortness of breath. -In ED CT chest was obtained which did show large related abscess in the left axilla, and stable pulmonary nodules, patient with elevated lactic acid at 3.1,With leukocytosis at 15.6 pseudohyponatremia of 127 (due to hyperglycemia), his blood glucose was 642, patient had bedside I&D by ED provider with 600 cc of purulent output drained, please see pictures below, [facility] hospitalist consulted to admit.  Assessment and Plan: 1-severe sepsis secondary to left axillary abscess -Status post I&D at the end of admission; still with significant ongoing purulent discharge/output. -Continue IV antibiotics -Appreciate assistance and recommendation by general surgery -Plan is for patient to be taken to the OR for proper debridement and drain placement. -Continue supportive care, maintain adequate hydration, continue as needed analgesics and antipyretics. -Follow any culture results. -Follow WBCs response.  2-history of colon cancer to be in remission -Status post surgical resection with colostomy in place -Continue to follow recommendations by wound care service as part of colostomy care. -Continue patient follow-up with  oncology service.  3-type 2 diabetes mellitus with hyperglycemia -Follow CBGs fluctuation and adjust hypoglycemic regimen as needed -Holding oral hypoglycemic agents while inpatient -Continue sliding scale insulin and Semglee.  4-neuropathy associated with cancer/diabetes -Continue gabapentin.  5-morbid obesity -Body mass index is 50.63 kg/m.  -Low-calorie diet, portion control and increasing activity discussed with patient.  6-pulmonary nodules -Appears to be stable on repeat CT scan at time of admission -Continue to follow-up with oncology service.  7-hyponatremia (pseudohyponatremia) the setting of hyperglycemia -Corrected sodium appears to be in the normal range -Maintain adequate hydration and follow electrolytes trend.  Subjective:  Morbidly obese; no chest pain no nausea, no vomiting.  Expressed having pain in his left axilla area.  Still with supinating area.  Currently afebrile.  Physical Exam: Vitals:   04/25/23 2136 04/26/23 0038 04/26/23 0345 04/26/23 1233  BP: 118/75 107/78 122/72 133/78  Pulse: (!) 110 (!) 112 (!) 101 87  Resp: 20   18  Temp: 98.9 F (37.2 C) 99.9 F (37.7 C) 99 F (37.2 C) 98.5 F (36.9 C)  TempSrc:  Oral Oral   SpO2: 97% 91% 95% 93%  Weight:      Height:       General exam: Alert, awake, oriented x 3; in no major distress. Respiratory system: Good saturation on room air; no using accessory muscles. Cardiovascular system:RRR. No rubs or gallops.  Unable to assess JVD with body habitus. Gastrointestinal system: Abdomen is obese, nondistended, soft and nontender.  Colostomy bag in place.  Positive bowel sounds. Central nervous system:  No focal neurological deficits. Extremities: No cyanosis or clubbing; bilateral lymphedema appreciated. Skin: No petechiae.  Left axillary area with dressings in place active purulent drainage appreciated. Psychiatry:  Judgement and insight appear normal.  Flat affect.  Data Reviewed: Basic metabolic  panel: Sodium 129, potassium 3.7, chloride 94, bicarb 26, glucose 276, BUN 8, creatinine 0.67 and GFR >60 CBC: WBCs 15.4, hemoglobin 11.8, platelet count 265K  Family Communication: No family at bedside.  Disposition: Status is: Inpatient Remains inpatient appropriate because: Continue IV antibiotics.   Planned Discharge Destination: Home  Time spent: 50 minutes  Author: Vassie Loll, MD 04/26/2023 6:04 PM  For on call review [email or web address].

## 2023-04-26 NOTE — Progress Notes (Signed)
RN assessed L axillary I&D incision. Copious amount of purulent light brown drainage. 2 ABD pads plus gauze packing is saturated. Site cleaned, repacked with gauze, ABD and dry gauze and tape to secure. RN will continue and monitor. Would care is following. Ostomy is intact over hernia.    0323 Pt had 8 beats of Vtach. Denies CP
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
## 2023-04-26 NOTE — Plan of Care (Signed)

## 2023-04-26 NOTE — Consult Note (Signed)
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 Flat with 2" barrier ring, due to peristomal hernia.  1pc flat will work best to accommodate changes in abdominal topography Education provided: NA, patient with stoma since 2022  [facility] [id number] [place] #[id number] Cut opening to fit stoma size, place 2" barrier ring (stretched to fit) around stoma.  Change weekly   Re consult if needed, will not follow at this time. Thanks  Bracha Frankowski [employer], RN,CWOCN, CNS, CWON-[facility] [phone])

## 2023-04-27 ENCOUNTER — Other Ambulatory Visit (HOSPITAL_COMMUNITY): Payer: Self-pay

## 2023-04-27 ENCOUNTER — Telehealth (HOSPITAL_COMMUNITY): Payer: Self-pay | Admitting: Pharmacy Technician

## 2023-04-27 ENCOUNTER — Inpatient Hospital Stay (HOSPITAL_COMMUNITY): Payer: 59 | Admitting: Certified Registered"

## 2023-04-27 ENCOUNTER — Encounter (HOSPITAL_COMMUNITY)
Admission: EM | Disposition: A | Discharge status: Home Health | Payer: Self-pay | Source: Home / Self Care | Attending: Internal Medicine

## 2023-04-27 DIAGNOSIS — I10 Essential (primary) hypertension: Secondary | ICD-10-CM | POA: Diagnosis not present

## 2023-04-27 DIAGNOSIS — C187 Malignant neoplasm of sigmoid colon: Secondary | ICD-10-CM | POA: Diagnosis not present

## 2023-04-27 DIAGNOSIS — R739 Hyperglycemia, unspecified: Secondary | ICD-10-CM | POA: Diagnosis not present

## 2023-04-27 DIAGNOSIS — C801 Malignant (primary) neoplasm, unspecified: Secondary | ICD-10-CM | POA: Diagnosis not present

## 2023-04-27 DIAGNOSIS — F17210 Nicotine dependence, cigarettes, uncomplicated: Secondary | ICD-10-CM

## 2023-04-27 DIAGNOSIS — L02412 Cutaneous abscess of left axilla: Secondary | ICD-10-CM | POA: Diagnosis not present

## 2023-04-27 HISTORY — PX: INCISION AND DRAINAGE ABSCESS: SHX5864

## 2023-04-27 LAB — GLUCOSE, CAPILLARY
Glucose-Capillary: 228 mg/dL — ABNORMAL HIGH (ref 70–99)
Glucose-Capillary: 284 mg/dL — ABNORMAL HIGH (ref 70–99)
Glucose-Capillary: 249 mg/dL — ABNORMAL HIGH (ref 70–99)
Glucose-Capillary: 248 mg/dL — ABNORMAL HIGH (ref 70–99)
Glucose-Capillary: 359 mg/dL — ABNORMAL HIGH (ref 70–99)
Glucose-Capillary: 212 mg/dL — ABNORMAL HIGH (ref 70–99)

## 2023-04-27 SURGERY — INCISION AND DRAINAGE, ABSCESS
Anesthesia: General | Site: Axilla | Laterality: Left

## 2023-04-27 MED ORDER — LIDOCAINE HCL (PF) 1 % IJ SOLN
INTRAMUSCULAR | Status: DC | PRN
  Administered 2023-04-27: 30 mL

## 2023-04-27 MED ORDER — PROPOFOL 10 MG/ML IV BOLUS
INTRAVENOUS | Status: AC
  Filled 2023-04-27: qty 20

## 2023-04-27 MED ORDER — ONDANSETRON HCL 4 MG/2ML IJ SOLN: 4.0000 mg | Freq: Once | INTRAMUSCULAR | Status: DC | PRN

## 2023-04-27 MED ORDER — ONDANSETRON HCL 4 MG/2ML IJ SOLN
INTRAMUSCULAR | Status: AC
  Filled 2023-04-27: qty 2

## 2023-04-27 MED ORDER — KETAMINE HCL 50 MG/5ML IJ SOSY
PREFILLED_SYRINGE | INTRAMUSCULAR | Status: AC
  Filled 2023-04-27: qty 5

## 2023-04-27 MED ORDER — OXYCODONE HCL 5 MG PO TABS
5.0000 mg | ORAL_TABLET | ORAL | Status: DC | PRN
  Administered 2023-04-27: 10 mg via ORAL
  Administered 2023-04-27: 5 mg via ORAL
  Administered 2023-04-27 – 2023-04-28 (×2): 10 mg via ORAL
  Administered 2023-04-28: 5 mg via ORAL
  Administered 2023-04-29: 10 mg via ORAL
  Filled 2023-04-27 (×2): qty 2
  Filled 2023-04-27: qty 1
  Filled 2023-04-27 (×3): qty 2

## 2023-04-27 MED ORDER — INSULIN STARTER KIT- PEN NEEDLES (ENGLISH)
1.0000 | Freq: Once | Status: AC
  Administered 2023-04-27: 1
  Filled 2023-04-27: qty 1

## 2023-04-27 MED ORDER — OXYCODONE HCL 5 MG PO TABS: 5.0000 mg | ORAL_TABLET | Freq: Once | ORAL | Status: DC | PRN

## 2023-04-27 MED ORDER — SODIUM CHLORIDE 0.9 % IR SOLN
Status: DC | PRN
  Administered 2023-04-27 (×2): 1000 mL

## 2023-04-27 MED ORDER — LIDOCAINE HCL (PF) 2 % IJ SOLN
INTRAMUSCULAR | Status: AC
  Filled 2023-04-27: qty 5

## 2023-04-27 MED ORDER — ORAL CARE MOUTH RINSE: 15.0000 mL | Freq: Once | OROMUCOSAL | Status: DC

## 2023-04-27 MED ORDER — LIVING WELL WITH DIABETES BOOK: Freq: Once | Status: AC

## 2023-04-27 MED ORDER — FENTANYL CITRATE PF 50 MCG/ML IJ SOSY
25.0000 ug | PREFILLED_SYRINGE | INTRAMUSCULAR | Status: DC | PRN
Start: 2023-04-27 — End: 2023-04-27

## 2023-04-27 MED ORDER — KETAMINE HCL 10 MG/ML IJ SOLN
INTRAMUSCULAR | Status: DC | PRN
  Administered 2023-04-27: 20 mg via INTRAVENOUS
  Administered 2023-04-27: 30 mg via INTRAVENOUS

## 2023-04-27 MED ORDER — FENTANYL CITRATE (PF) 100 MCG/2ML IJ SOLN
INTRAMUSCULAR | Status: DC | PRN
  Administered 2023-04-27 (×2): 50 ug via INTRAVENOUS

## 2023-04-27 MED ORDER — CHLORHEXIDINE GLUCONATE 0.12 % MT SOLN: 15.0000 mL | Freq: Once | OROMUCOSAL | Status: DC

## 2023-04-27 MED ORDER — MIDAZOLAM HCL 2 MG/2ML IJ SOLN
INTRAMUSCULAR | Status: AC
  Filled 2023-04-27: qty 2

## 2023-04-27 MED ORDER — LACTATED RINGERS IV SOLN: INTRAVENOUS | Status: DC | PRN

## 2023-04-27 MED ORDER — OXYCODONE HCL 5 MG/5ML PO SOLN: 5.0000 mg | Freq: Once | ORAL | Status: DC | PRN

## 2023-04-27 MED ORDER — LIDOCAINE HCL (PF) 1 % IJ SOLN
INTRAMUSCULAR | Status: AC
  Filled 2023-04-27: qty 30

## 2023-04-27 MED ORDER — MIDAZOLAM HCL 2 MG/2ML IJ SOLN
INTRAMUSCULAR | Status: DC | PRN
  Administered 2023-04-27 (×2): 1 mg via INTRAVENOUS

## 2023-04-27 MED ORDER — LIDOCAINE HCL (PF) 2 % IJ SOLN
INTRAMUSCULAR | Status: DC | PRN
  Administered 2023-04-27: 50 mg via INTRADERMAL

## 2023-04-27 MED ORDER — PROPOFOL 10 MG/ML IV BOLUS
INTRAVENOUS | Status: DC | PRN
  Administered 2023-04-27 (×2): 30 mg via INTRAVENOUS

## 2023-04-27 MED ORDER — FENTANYL CITRATE (PF) 100 MCG/2ML IJ SOLN
INTRAMUSCULAR | Status: AC
  Filled 2023-04-27: qty 2

## 2023-04-27 MED ORDER — LACTATED RINGERS IV SOLN: INTRAVENOUS | Status: DC

## 2023-04-27 SURGICAL SUPPLY — 29 items
CLOTH BEACON ORANGE TIMEOUT ST (SAFETY) ×1 IMPLANT
COVER LIGHT HANDLE STERIS (MISCELLANEOUS) ×2 IMPLANT
ELECT REM PT RETURN 9FT ADLT (ELECTROSURGICAL) ×1
ELECTRODE REM PT RTRN 9FT ADLT (ELECTROSURGICAL) ×1 IMPLANT
EVACUATOR DRAINAGE 10X20 100CC (DRAIN) IMPLANT
GAUZE SPONGE 4X4 12PLY STRL (GAUZE/BANDAGES/DRESSINGS) ×2 IMPLANT
GLOVE BIO SURGEON STRL SZ 6.5 (GLOVE) ×1 IMPLANT
GLOVE BIO SURGEON STRL SZ7 (GLOVE) IMPLANT
GLOVE BIOGEL PI IND STRL 6.5 (GLOVE) ×1 IMPLANT
GLOVE BIOGEL PI IND STRL 7.0 (GLOVE) ×2 IMPLANT
GOWN STRL REUS W/TWL LRG LVL3 (GOWN DISPOSABLE) ×2 IMPLANT
KIT TURNOVER KIT A (KITS) ×1 IMPLANT
MANIFOLD NEPTUNE II (INSTRUMENTS) ×1 IMPLANT
NDL HYPO 21X1.5 SAFETY (NEEDLE) IMPLANT
NEEDLE HYPO 21X1.5 SAFETY (NEEDLE) ×1 IMPLANT
NS IRRIG 1000ML POUR BTL (IV SOLUTION) ×1 IMPLANT
PACK MINOR (CUSTOM PROCEDURE TRAY) ×1 IMPLANT
PAD ABD 5X9 TENDERSORB (GAUZE/BANDAGES/DRESSINGS) IMPLANT
PAD ARMBOARD 7.5X6 YLW CONV (MISCELLANEOUS) ×1 IMPLANT
POSITIONER HEAD 8X9X4 ADT (SOFTGOODS) ×1 IMPLANT
SET BASIN LINEN APH (SET/KITS/TRAYS/PACK) ×1 IMPLANT
SOL PREP PROV IODINE SCRUB 4OZ (MISCELLANEOUS) ×1 IMPLANT
SPONGE DRAIN TRACH 4X4 STRL 2S (GAUZE/BANDAGES/DRESSINGS) IMPLANT
SPONGE T-LAP 18X18 ~~LOC~~+RFID (SPONGE) IMPLANT
SUT ETHILON 3 0 FSL (SUTURE) IMPLANT
SUT SILK 2 0 SH (SUTURE) IMPLANT
SUT SILK 3-0 FS1 18XBRD (SUTURE) IMPLANT
SYR 30ML LL (SYRINGE) IMPLANT
SYR BULB IRRIG 60ML STRL (SYRINGE) ×1 IMPLANT

## 2023-04-27 NOTE — Plan of Care (Signed)

## 2023-04-27 NOTE — Progress Notes (Addendum)
[facility]  Updated the fiance and team.   JP Drain Care:  Please keep the drain clean and dry. Replace the gauze/ tape around the drain if it gets dirty or wet/ saturated. Please do not mess with or cut the stitch that is keeping the drain in place. Secure the drain to your clothes so that it does not get dislodged.  Please record the output from the drains daily including the color and the amount in milliliters. Record each drain separate.  Please flush the drain with 30 cc of saline twice a day as instructed by your nurse. (See Photo Below)  Please keep the drain covered with plastic and tape when you shower so that it does not get wet.

## 2023-04-27 NOTE — Interval H&P Note (Signed)
History and Physical Interval Note:  04/27/2023 9:59 AM  Jose Melendez  has presented today for surgery, with the diagnosis of left axillary abscess.  The various methods of treatment have been discussed with the patient and family. After consideration of risks, benefits and other options for treatment, the patient has consented to  Procedure(s): INCISION AND DRAINAGE ABSCESS, left axilla, with drain placement (Left) as a surgical intervention.  The patient's history has been reviewed, patient examined, no change in status, stable for surgery.  I have reviewed the patient's chart and labs.  Questions were answered to the patient's satisfaction.    Marked  Lucretia Roers

## 2023-04-27 NOTE — Anesthesia Preprocedure Evaluation (Signed)
Anesthesia Evaluation  Patient identified by MRN, date of birth, ID band Patient awake    Reviewed: Allergy & Precautions, H&P , NPO status , Patient's Chart, lab work & pertinent test results, reviewed documented beta blocker date and time   Airway Mallampati: II  TM Distance: >3 FB Neck ROM: full    Dental no notable dental hx.    Pulmonary neg pulmonary ROS, Current Smoker and Patient abstained from smoking.   Pulmonary exam normal breath sounds clear to auscultation       Cardiovascular Exercise Tolerance: Good hypertension, negative cardio ROS  Rhythm:regular Rate:Normal     Neuro/Psych negative neurological ROS  negative psych ROS   GI/Hepatic negative GI ROS, Neg liver ROS,,,  Endo/Other  negative endocrine ROS    Renal/GU negative Renal ROS  negative genitourinary   Musculoskeletal   Abdominal   Peds  Hematology negative hematology ROS (+)   Anesthesia Other Findings   Reproductive/Obstetrics negative OB ROS                             Anesthesia Physical Anesthesia Plan  ASA: 3 and emergent  Anesthesia Plan: General   Post-op Pain Management:    Induction:   PONV Risk Score and Plan: Ondansetron  Airway Management Planned:   Additional Equipment:   Intra-op Plan:   Post-operative Plan:   Informed Consent: I have reviewed the patients History and Physical, chart, labs and discussed the procedure including the risks, benefits and alternatives for the proposed anesthesia with the patient or authorized representative who has indicated his/her understanding and acceptance.     Dental Advisory Given  Plan Discussed with: CRNA  Anesthesia Plan Comments:        Anesthesia Quick Evaluation

## 2023-04-27 NOTE — Anesthesia Procedure Notes (Signed)
Date/Time: 04/27/2023 12:09 PM  Performed by: Julian Reil, CRNAPre-anesthesia Checklist: Patient identified, Emergency Drugs available, Suction available and Patient being monitored Patient Re-evaluated:Patient Re-evaluated prior to induction Oxygen Delivery Method: Simple face mask Induction Type: IV induction Placement Confirmation: positive ETCO2

## 2023-04-27 NOTE — Discharge Instructions (Signed)
JP Drain Care:  Please keep the drain clean and dry. Replace the gauze/ tape around the drain if it gets dirty or wet/ saturated. Please do not mess with or cut the stitch that is keeping the drain in place. Secure the drain to your clothes so that it does not get dislodged.  Please record the output from the drains daily including the color and the amount in milliliters. Record each drain separate.  Please flush the drain with 30 cc of saline twice a day as instructed by your nurse. (See Photo Below)  Please keep the drain covered with plastic and tape when you shower so that it does not get wet.

## 2023-04-27 NOTE — Telephone Encounter (Signed)
Patient Product/process development scientist completed.    The patient is insured through [employer] and [employer].     Ran test claim for Basaglar Pen and the current 30 day co-pay is $4.00.  Ran test claim for Novolog Pen and the current 30 day co-pay is $4.00.  Ran test claim for Novolog 70/30 FlexPen and the current 30 day co-pay is $4.00.    This test claim was processed through [facility]- copay amounts may vary at other pharmacies due to pharmacy/plan contracts, or as the patient moves through the different stages of their insurance plan.     Roland Earl, CPHT Pharmacy Technician III Certified Patient Advocate [facility] Patient Advocate Team Direct Number: [phone]  Fax: [phone]

## 2023-04-27 NOTE — Inpatient Diabetes Management (Signed)
Inpatient Diabetes Program Recommendations  AACE/ADA: New Consensus Statement on Inpatient Glycemic Control (2015)  Target Ranges:  Prepandial:   less than 140 mg/dL      Peak postprandial:   less than 180 mg/dL (1-2 hours)      Critically ill patients:  140 - 180 mg/dL   Lab Results  Component Value Date   GLUCAP 228 (H) 04/27/2023   HGBA1C 14.7 (H) 04/25/2023    Review of Glycemic Control  Diabetes history: DM2 Outpatient Diabetes medications: metformin 500 daily Current orders for Inpatient glycemic control: Semglee 12 units daily, Novolog 0-20 TID with meals and 0-5 HS  HgbA1C - 14.7% CBG 228 this am. Needs meal coverage insulin  Inpatient Diabetes Program Recommendations:    Consider adding Novolog 6 units TID with meals if eating > 50%.  Consider increasing Semglee to 20 units daily  Spoke with pt on phone about his diabetes and HgbA1C of 14.7%. Pt states he's been on metformin at home. Does not monitor blood sugars at home. Discussed A1C results 14.7% and explained that current A1C indicates average glucose of 375 mg/dL over the past 2-3 months. Discussed both glucose and HgbA1C goals. Discussed importance of checking CBGs and maintaining good glucose control to prevent long and short-term complications. Explained how hyperglycemia leads to damage within blood vessels which lead to the common complications seen with uncontrolled diabetes. Stressed to the patient the importance of improving glycemic control to prevent further complications from uncontrolled diabetes. Discussed impact of nutrition, exercise, stress, sickness, and medications on diabetes control.  Discussed carbohydrates, portion control, increasing high-fiber foods. Stressed importance of f/u with PCP and instructed to take blood sugar log for MD to review and make adjustments with insulin/diabetes meds. Pt appreciative of information and Diabetes Coordinator to see pt in am.  RN instructed on insulin pen  administration. Pt states he was able to give insulin injection today without any problems.  Continue to follow.  Thank you. Ailene Ards, RD, LDN, CDCES Inpatient Diabetes Coordinator [phone]

## 2023-04-27 NOTE — Progress Notes (Signed)
Progress Note   Patient: Jose Melendez:[id number] DOB: 08-07-1970 DOA: 04/25/2023     2 DOS: the patient was seen and examined on 04/27/2023   Brief hospital admission course: As per H&P written by Dr. Randol Melendez on 04/25/2023. Jose Melendez  is a 52 y.o. male, with past medical history of morbid obesity, diabetes mellitus, type II, adenocarcinoma stage II, s/p resection, chemotherapy and with colostomy pulmonary nodules, he is following with Dr. Ellin Melendez. -Presents to ED secondary to worsening abscess in the left axilla, reports abscess started a week ago, he went to urgent care where he was started on Cipro, and had referral to surgery, reports he did follow-up with surgery and they referred him to ED, patient reports some chills, fever at home, chest pain, no shortness of breath. -In ED CT chest was obtained which did show large related abscess in the left axilla, and stable pulmonary nodules, patient with elevated lactic acid at 3.1,With leukocytosis at 15.6 pseudohyponatremia of 127 (due to hyperglycemia), his blood glucose was 642, patient had bedside I&D by ED provider with 600 cc of purulent output drained, please see pictures below, [facility] hospitalist consulted to admit.  Assessment and Plan: 1-severe sepsis secondary to left axillary abscess -Status post I&D at the end of admission; still with significant ongoing purulent discharge/output. -Continue IV antibiotics -Appreciate assistance and recommendation by general surgery -Plan is for patient to be taken to the OR for proper debridement and drain placement later today; will follow general surgery recommendations.. -Continue supportive care, maintain adequate hydration, continue as needed analgesics and antipyretics. -Follow any culture results. -Continue to follow WBCs response.  2-history of colon cancer to be in remission -Status post surgical resection with colostomy in place -Continue to follow recommendations by wound care  service as part of colostomy care. -Continue patient follow-up with oncology service.  3-uncontrolled type 2 diabetes mellitus with hyperglycemia -Follow CBGs fluctuation and adjust hypoglycemic regimen as needed -Holding oral hypoglycemic agents while inpatient -Continue sliding scale insulin and Semglee. -Patient A1c above 14 -Will require treatment with insulin at discharge -Diabetes continue to consult.  4-neuropathy associated with cancer/diabetes -Continue gabapentin.  5-morbid obesity -Body mass index is 50.63 kg/m.  -Low-calorie diet, portion control and increasing activity discussed with patient.  6-pulmonary nodules -Appears to be stable on repeat CT scan at time of admission -Continue to follow-up with oncology service.  7-hyponatremia (pseudohyponatremia) the setting of hyperglycemia -Corrected sodium appears to be in the normal range -Maintain adequate hydration and follow electrolytes trend.  Subjective:  In no major distress; no overnight events reported.  Stable and ready for surgical debridement and drain placement on his left axillary abscess.  Physical Exam: Vitals:   04/27/23 1315 04/27/23 1330 04/27/23 1345 04/27/23 1443  BP: 137/73 (!) 140/77 (!) 140/76 (!) 138/93  Pulse: 89 86 91 96  Resp: 13 14 19 19   Temp:   97.8 F (36.6 C) 98.1 F (36.7 C)  TempSrc:    Oral  SpO2: 97% 95% 96% 93%  Weight:      Height:       General exam: Alert, awake, oriented x 3; afebrile and in no major distress. Respiratory system: Good saturation on room air; no using accessory muscles. Cardiovascular system: No rubs, no gallops, unable to assess JVD with body habitus.. Gastrointestinal system: Abdomen is nondistended, soft and nontender.  Positive bowel sounds appreciated.  Colostomy bag in place. Central nervous system: No focal neurological deficits. Extremities: No cyanosis or clubbing; bilateral lymphedema appreciated  on exam. Skin: No petechiae.  Left axillary  area with dressing in place.  Per nursing staff report patient continued to have active purulent drainage. Psychiatry: Judgement and insight appear normal.  Flat affect appreciated on exam.  Latest data Reviewed: Basic metabolic panel: Sodium 129, potassium 3.7, chloride 94, bicarb 26, glucose 276, BUN 8, creatinine 0.67 and GFR >60 CBC: WBCs 15.4, hemoglobin 11.8, platelet count 265K A1c: 14.5.  Family Communication: No family at bedside.  Disposition: Status is: Inpatient Remains inpatient appropriate because: Continue IV antibiotics.   Planned Discharge Destination: Home  Time spent: 50 minutes  Author: Vassie Loll, MD 04/27/2023 6:43 PM  For on call review [email or web address].

## 2023-04-27 NOTE — Op Note (Signed)
[facility] Operative Note  04/27/23  Preoperative Diagnosis: Left axillary abscess    Postoperative Diagnosis: Same   Procedure(s) Performed: Incision and drainage, drain placement X2   Surgeon: Leatrice Jewels. Henreitta Leber, MD   Assistants: No qualified resident was available    Anesthesia: General endotracheal   Anesthesiologist: Windell [place], MD    Specimens: None    Estimated Blood Loss: Minimal   Blood Replacement: None    Complications: None   Wound Class: Dirty/ infected   Operative Indications: [name] is a 53 yo with multiple medical issues including diabetes, metastatic colon cancer who has a large axillary abscess. The ED attempted drainage and was able to evacuate 600 cc of purulence. The area has continued to re-accumulate and I discussed with him operative drainage and washout with drain placement given the size of the area. Discussed risk of bleeding, infection, needing the drains for several weeks, recurrence of abscess due to his diabetes and immune status. 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   Findings: Large amount of purulent drainage with packing removal, loculations    Procedure: The patient was taken to the operating room and placed supine. Monitored anesthesia was induced. Intravenous antibiotics were not administered as he was already on antibiotics and has MRSA growing from the cultures from the initial I&D in the ED. The left axilla was prepped and draped in the usual sterile fashion.   The prior I&D site was probed and purulence was evacuated. Lidocaine was injected superior to that incision and a counter incision was made. Both incisions were probed and loculations were broken up with hemostats. 2L of irrigation was performed in the area. A JP drain was placed through the initial I&D site and secured with 3-0 Nylon suture. A second JP was placed through my I&D site and this superior JP had a three way stop cock on the tubing  to help with irrigation to the cavity given the size. The drain was secured with 3-0 Nylon sutures. Dressing was placed.   Final inspection revealed acceptable hemostasis. All counts were correct at the end of the case. The patient was awakened from anesthesia without complication.  The patient went to the PACU in stable condition.   Algis Greenhouse, MD [facility] [address] [place], [place] [address] [phone] (office)

## 2023-04-27 NOTE — Transfer of Care (Signed)
Immediate Anesthesia Transfer of Care Note  Patient: Jose Melendez  Procedure(s) Performed: INCISION AND DRAINAGE ABSCESS, LEFT AXILLA, WITH DRAIN PLACEMENT X2 (Left: Axilla)  Patient Location: PACU  Anesthesia Type:General  Level of Consciousness: awake, alert , and oriented  Airway & Oxygen Therapy: Patient Spontanous Breathing  Post-op Assessment: Report given to RN and Post -op Vital signs reviewed and stable  Post vital signs: Reviewed and stable  Last Vitals:  Vitals Value Taken Time  BP 149/75   Temp 98.3   Pulse 90   Resp 16   SpO2 92%     Last Pain:  Vitals:   04/27/23 1057  TempSrc:   PainSc: 0-No pain         Complications: No notable events documented.

## 2023-04-28 ENCOUNTER — Telehealth (HOSPITAL_COMMUNITY): Payer: Self-pay | Admitting: Pharmacy Technician

## 2023-04-28 ENCOUNTER — Other Ambulatory Visit (HOSPITAL_COMMUNITY): Payer: Self-pay

## 2023-04-28 DIAGNOSIS — C187 Malignant neoplasm of sigmoid colon: Secondary | ICD-10-CM | POA: Diagnosis not present

## 2023-04-28 DIAGNOSIS — C801 Malignant (primary) neoplasm, unspecified: Secondary | ICD-10-CM | POA: Diagnosis not present

## 2023-04-28 DIAGNOSIS — L02412 Cutaneous abscess of left axilla: Secondary | ICD-10-CM | POA: Diagnosis not present

## 2023-04-28 DIAGNOSIS — R739 Hyperglycemia, unspecified: Secondary | ICD-10-CM | POA: Diagnosis not present

## 2023-04-28 LAB — BASIC METABOLIC PANEL
Anion gap: 8 (ref 5–15)
BUN: 10 mg/dL (ref 6–20)
CO2: 30 mmol/L (ref 22–32)
Calcium: 8.4 mg/dL — ABNORMAL LOW (ref 8.9–10.3)
Chloride: 94 mmol/L — ABNORMAL LOW (ref 98–111)
Creatinine, Ser: 0.72 mg/dL (ref 0.61–1.24)
GFR, Estimated: >60 mL/min (ref >60)
Glucose, Bld: 273 mg/dL — ABNORMAL HIGH (ref 70–99)
Potassium: 4 mmol/L (ref 3.5–5.1)
Sodium: 132 mmol/L — ABNORMAL LOW (ref 135–145)

## 2023-04-28 LAB — GLUCOSE, CAPILLARY
Glucose-Capillary: 250 mg/dL — ABNORMAL HIGH (ref 70–99)
Glucose-Capillary: 268 mg/dL — ABNORMAL HIGH (ref 70–99)
Glucose-Capillary: 211 mg/dL — ABNORMAL HIGH (ref 70–99)
Glucose-Capillary: 207 mg/dL — ABNORMAL HIGH (ref 70–99)

## 2023-04-28 MED ORDER — VANCOMYCIN HCL 750 MG/150ML IV SOLN: 750.0000 mg | Freq: Two times a day (BID) | INTRAVENOUS | Status: DC

## 2023-04-28 MED ORDER — VANCOMYCIN HCL IN DEXTROSE 1-5 GM/200ML-% IV SOLN: 1000.0000 mg | Freq: Two times a day (BID) | INTRAVENOUS | Status: DC

## 2023-04-28 MED ORDER — VANCOMYCIN HCL IN DEXTROSE 1-5 GM/200ML-% IV SOLN
1000.0000 mg | Freq: Two times a day (BID) | INTRAVENOUS | Status: DC
  Administered 2023-04-29: 1000 mg via INTRAVENOUS
  Filled 2023-04-28: qty 200

## 2023-04-28 MED ORDER — VANCOMYCIN HCL 750 MG/150ML IV SOLN
750.0000 mg | Freq: Two times a day (BID) | INTRAVENOUS | Status: DC
  Administered 2023-04-29: 750 mg via INTRAVENOUS
  Filled 2023-04-28: qty 150

## 2023-04-28 MED ORDER — VANCOMYCIN HCL 1750 MG/350ML IV SOLN
1750.0000 mg | Freq: Once | INTRAVENOUS | Status: AC
  Administered 2023-04-28: 1750 mg via INTRAVENOUS
  Filled 2023-04-28: qty 350

## 2023-04-28 NOTE — Inpatient Diabetes Management (Addendum)
Inpatient Diabetes Program Recommendations  AACE/ADA: New Consensus Statement on Inpatient Glycemic Control   Target Ranges:  Prepandial:   less than 140 mg/dL      Peak postprandial:   less than 180 mg/dL (1-2 hours)      Critically ill patients:  140 - 180 mg/dL    Latest Reference Range & Units 04/27/23 07:36 04/27/23 10:49 04/27/23 13:14 04/27/23 14:24 04/27/23 17:04 04/27/23 21:45  Glucose-Capillary 70 - 99 mg/dL 762 (H) 831 (H) 517 (H) 248 (H) 359 (H) 212 (H)    Latest Reference Range & Units 04/25/23 13:12  Hemoglobin A1C 4.8 - 5.6 % 14.7 (H)   Review of Glycemic Control  Diabetes history: DM2 Outpatient Diabetes medications: Metformin 500 mg QAM Current orders for Inpatient glycemic control: Semglee 12 units daily, Novolog 0-20 units TID with meals, Novolog 0-5 units QHS  Inpatient Diabetes Program Recommendations:    Insulin: Please consider increasing Semglee to 20 units daily and adding Novolog 6 units TID with meals for meal coverage if patient eats at least 50% of meals.  HbgA1C:  A1C 14.7% on 04/25/23 indicating an average glucose of 375 mg/dl over the past 2-3 months.  Outpatient DM: If patient is discharged on insulin, insurance covers Hospital doctor and Novolog insulin pens. Please provide Rx for glucose monitoring kit (#[id number]), Basaglar pens [phone]), Novolog pens [phone]), insulin pen needles (#[id number]), Dexcom G7 sensors (#[id number]) and Dexcom G7 reader (#[id number]).  Addendum 04/28/23@11 :40-Spoke with patient about diabetes and home regimen for diabetes control. Patient reports being followed by PCP for diabetes management and currently taking Metformin 500 mg QAM as an outpatient for diabetes control. Patient reports taking DM medications as prescribed. Patient is not monitoring glucose at home and does not have a glucometer or supplies at home.  Discussed A1C results (14.7% on 04/25/23) and explained that current A1C indicates an average glucose of 375 mg/dl over the past 2-3  months. Discussed glucose and A1C goals. Discussed importance of checking CBGs and maintaining good CBG control to prevent long-term and short-term complications. Explained how hyperglycemia leads to damage within blood vessels which lead to the common complications seen with uncontrolled diabetes. Stressed to the patient the importance of improving glycemic control to prevent further complications from uncontrolled diabetes. Discussed impact of nutrition, exercise, stress, sickness, and medications on diabetes control.  Discussed possibly using Dexcom CGM sensor for glucose readings. Patient states that he would like to try the Dexcom G7 if possible. Patient's phone is not compatible with Dexcom G7 app; discussed using Dexcom G7 reader. Discussed how to use and apply Dexcom G7 sensor; had patient watch youtube video on the Dexcom G7 sensor.  Discussed insulin administration; patient has already practiced using insulin pen and given himself an insulin injection while inpatient. Discussed injection sites, rotation of sites, storage of insulin, and reviewed steps of how to use an insulin pen.  Patient was able to return demonstrate how to use the insulin pen and feels comfortable with it.  Patient verbalized understanding of information discussed and reports no further questions at this time related to diabetes.  Thanks, Orlando Penner, RN, MSN, CDCES Diabetes Coordinator Inpatient Diabetes Program [phone] (Team Pager from 8am to 5pm)

## 2023-04-28 NOTE — Telephone Encounter (Signed)
Patient Product/process development scientist completed.    The patient is insured through [employer] and [employer].     Ran test claim for [employer] and Requires Prior Authorization  Ran test claim for [employer] and Non Formulary  This test claim was processed through [employer]- copay amounts may vary at other pharmacies due to [employer], or as the patient moves through the different stages of their insurance plan.     Roland Earl, CPHT Pharmacy Technician III Certified Patient Advocate [facility] Patient Advocate Team Direct Number: [phone]  Fax: [phone]

## 2023-04-28 NOTE — TOC Initial Note (Addendum)
Transition of Care [facility]) - Initial/Assessment Note    Patient Details  Name: Jose Melendez MRN: [id number] Date of Birth: [birth year]  Transition of Care [facility]) CM/SW Contact:    Karn Cassis, LCSW Phone Number: 04/28/2023, 1:56 PM  Clinical Narrative:  Pt admitted due to severe sepsis secondary to abscess. Pt will d/c with JP drain and will need HHRN per MD. Discussed with pt who is agreeable and no preference on agency. Referred and accepted by [facility] with [facility]. Will need HHRN order. TOC will follow.                  Expected Discharge Plan: Home w Home Health Services Barriers to Discharge: Continued Medical Work up   Patient Goals and CMS Choice Patient states their goals for this hospitalization and ongoing recovery are:: return home   Choice offered to / list presented to : Patient [place] ownership interest in [facility].provided to::  (n/a)    Expected Discharge Plan and Services In-house Referral: Clinical Social Work     Living arrangements for the past 2 months: Single Family Home                           HH Arranged: RN HH Agency: [facility] Date [facility] Agency Contacted: 04/28/23 Time HH Agency Contacted: 1355 Representative spoke with at [facility] Agency: Rolly Salter  Prior Living Arrangements/Services Living arrangements for the past 2 months: Single Family Home Lives with:: Relatives Patient language and need for interpreter reviewed:: Yes Do you feel safe going back to the place where you live?: Yes      Need for Family Participation in Patient Care: No (Comment)     Criminal Activity/Legal Involvement Pertinent to Current Situation/Hospitalization: No - Comment as needed  Activities of Daily Living      Permission Sought/Granted                  Emotional Assessment     Affect (typically observed): Appropriate Orientation: : Oriented to Self, Oriented to Place, Oriented to  Time, Oriented to Situation Alcohol /  Substance Use: Not Applicable Psych Involvement: No (comment)  Admission diagnosis:  Hyperglycemia [R73.9] Abscess of axilla, left [L02.412] Sepsis [facility]) [A41.9] Patient Active Problem List   Diagnosis Date Noted   Abscess of axilla, left 04/26/2023   Hyperglycemia 04/25/2023   Sepsis (HCC) 04/25/2023   Encounter for colonoscopy due to history of colon cancer 07/08/2022   Neuropathy associated with cancer (HCC) 04/23/2021   Port-A-Cath in place 04/03/2021   Adenocarcinoma of sigmoid colon (HCC) 01/30/2021   Diverticulitis 01/01/2021   PCP:  Lovey Newcomer, PA Pharmacy:   [facility] - [place], [place] - [address] [place] [address] Phone: [phone] Fax: [phone]  [facility] [place], [place] - [address] [place] [address] Phone: [phone] Fax: [phone]     Social Determinants of Health (SDOH) Social History: SDOH Screenings   Food Insecurity: No Food Insecurity (04/25/2023)  Housing: Low Risk  (04/25/2023)  Transportation Needs: No Transportation Needs (04/25/2023)  Utilities: Not At Risk (04/25/2023)  Depression (PHQ2-9): Low Risk  (06/03/2022)  Financial Resource Strain: Low Risk  (03/27/2021)   [facility] from [facility]  Physical Activity: Sufficiently Active (03/20/2021)   Received from [facility], [facility]  Recent Concern: Physical Activity - Insufficiently Active (01/13/2021)  Received from [facility] visits prior to 07/19/2022., [facility] visits prior to 07/19/2022.  Social Connections: Moderately Integrated (03/20/2021)   [facility], [facility]  Recent Concern: Social Connections - Moderately Isolated (01/13/2021)   Received from [facility] visits prior to 07/19/2022., [facility] visits prior to 07/19/2022.  Stress: No Stress Concern Present (03/27/2021)    Received from [facility], [facility]  Tobacco Use: High Risk (04/25/2023)  Health Literacy: Low Risk  (03/27/2021)   Received from [facility], [facility]   SDOH Interventions:     Readmission Risk Interventions     No data to display

## 2023-04-28 NOTE — Progress Notes (Signed)
Progress Note   Patient: Jose Melendez HQI:[id number] DOB: 1970-07-03 DOA: 04/25/2023     3 DOS: the patient was seen and examined on 04/28/2023   Brief hospital admission course: As per H&P written by Dr. Randol Kern on 04/25/2023. Jose Melendez  is a 52 y.o. male, with past medical history of morbid obesity, diabetes mellitus, type II, adenocarcinoma stage II, s/p resection, chemotherapy and with colostomy pulmonary nodules, he is following with Dr. Ellin Saba. -Presents to ED secondary to worsening abscess in the left axilla, reports abscess started a week ago, he went to urgent care where he was started on Cipro, and had referral to surgery, reports he did follow-up with surgery and they referred him to ED, patient reports some chills, fever at home, chest pain, no shortness of breath. -In ED CT chest was obtained which did show large related abscess in the left axilla, and stable pulmonary nodules, patient with elevated lactic acid at 3.1,With leukocytosis at 15.6 pseudohyponatremia of 127 (due to hyperglycemia), his blood glucose was 642, patient had bedside I&D by ED provider with 600 cc of purulent output drained, please see pictures below, [facility] hospitalist consulted to admit.  Assessment and Plan: 1-severe sepsis secondary to left axillary abscess -Status post I&D at the end of admission; still with significant ongoing purulent discharge/output. -Appreciate assistance and recommendation by general surgery -Status post surgical debridement and trach placement -Planning to continue IV antibiotics for another 24 hours and anticipate discharge home on 04/29/2023 with plan for another 7 days of oral antibiotics. -Continue supportive care, maintain adequate hydration, continue as needed analgesics and antipyretics. -Follow culture results. -Continue to follow WBCs response.  2-history of colon cancer to be in remission -Status post surgical resection with colostomy in place -Continue to follow  recommendations by wound care service as part of colostomy care. -Continue patient follow-up with oncology service.  3-uncontrolled type 2 diabetes mellitus with hyperglycemia -Follow CBGs fluctuation and adjust hypoglycemic regimen as needed -Holding oral hypoglycemic agents while inpatient -Continue sliding scale insulin and Semglee. -Patient A1c above 14 -Will require treatment with insulin at discharge -Diabetes coordinator has been consulted to assist with teaching and recommendations regarding insulin therapy at discharge.  4-neuropathy associated with cancer/diabetes -Continue gabapentin.  5-morbid obesity -Body mass index is 50.63 kg/m.  -Low-calorie diet, portion control and increasing activity discussed with patient.  6-pulmonary nodules -Appears to be stable on repeat CT scan at time of admission -Continue to follow-up with oncology service.  7-hyponatremia (pseudohyponatremia) the setting of hyperglycemia -Corrected sodium appears to be in the normal range -Maintain adequate hydration and follow electrolytes trend.  Subjective:  No fever, no chest pain, no nausea, no vomiting.  Reporting pain in his left costal axillary area.  No overnight events reported.  Physical Exam: Vitals:   04/27/23 1443 04/27/23 2148 04/28/23 0556 04/28/23 1344  BP: (!) 138/93 113/83 104/61 118/70  Pulse: 96 91  80  Resp: 19 16 15    Temp: 98.1 F (36.7 C) 97.7 F (36.5 C) 98.5 F (36.9 C) 97.8 F (36.6 C)  TempSrc: Oral Oral    SpO2: 93% 97%  90%  Weight:      Height:       General exam: Alert, awake, oriented x 3; reporting no fever and otherwise feeling stable.  No chest pain, no nausea, no vomiting. Respiratory system: Clear to auscultation. Respiratory effort normal.  Good saturation on room air. Cardiovascular system: No rubs, no gallops, unable to properly assess JVD with body habitus.  Gastrointestinal system: Abdomen is obese, nondistended, soft and nontender. No  organomegaly or masses felt. Normal bowel sounds heard. Central nervous system:No focal neurological deficits. Extremities: No cyanosis or clubbing; chronic bilateral lymphedema appreciated.  Unchanged from baseline. Skin: No petechiae.  Left axillary area with postsurgical dressing and bilateral drain in place. Psychiatry: Judgement and insight appear normal. Mood & affect appropriate.   Latest data Reviewed: Basic metabolic panel: Sodium 129, potassium 3.7, chloride 94, bicarb 26, glucose 276, BUN 8, creatinine 0.67 and GFR >60 CBC: WBCs 15.4, hemoglobin 11.8, platelet count 265K A1c: 14.7.  Family Communication: No family at bedside.  Disposition: Status is: Inpatient Remains inpatient appropriate because: Continue IV antibiotics.   Planned Discharge Destination: Home  Time spent: 50 minutes  Author: Vassie Loll, MD 04/28/2023 6:14 PM  For on call review [email or web address].

## 2023-04-28 NOTE — Progress Notes (Signed)
[facility]  Needs to learn drain care.  Antibiotics for a total of 10 days should be sufficient.   Future Appointments  Date Time Provider Department Center  05/05/2023 10:45 AM Lucretia Roers, MD [facility]-RS None  06/23/2023  3:00 PM [facility] LAB [facility] None  06/23/2023  4:00 PM AP-CT 1 AP-CT [place] [facility]  07/01/2023  2:00 PM Doreatha Massed, MD [facility] None  07/01/2023  3:00 PM [facility] NURSE [facility] None    Algis Greenhouse, MD [facility] [address] [place], [place] [address] [phone] (office)

## 2023-04-28 NOTE — Anesthesia Postprocedure Evaluation (Signed)
Anesthesia Post Note  Patient: Jose Melendez  Procedure(s) Performed: INCISION AND DRAINAGE ABSCESS, LEFT AXILLA, WITH DRAIN PLACEMENT X2 (Left: Axilla)  Patient location during evaluation: Phase II Anesthesia Type: General Level of consciousness: awake Pain management: pain level controlled Vital Signs Assessment: post-procedure vital signs reviewed and stable Respiratory status: spontaneous breathing and respiratory function stable Cardiovascular status: blood pressure returned to baseline and stable Postop Assessment: no headache and no apparent nausea or vomiting Anesthetic complications: no Comments: Late entry   No notable events documented.   Last Vitals:  Vitals:   04/27/23 2148 04/28/23 0556  BP: 113/83 104/61  Pulse: 91   Resp: 16 15  Temp: 36.5 C 36.9 C  SpO2: 97%     Last Pain:  Vitals:   04/28/23 0020  TempSrc:   PainSc: 3                  Windell Norfolk

## 2023-04-29 ENCOUNTER — Encounter (HOSPITAL_COMMUNITY): Payer: Self-pay | Admitting: General Surgery

## 2023-04-29 DIAGNOSIS — A4102 Sepsis due to Methicillin resistant Staphylococcus aureus: Secondary | ICD-10-CM

## 2023-04-29 DIAGNOSIS — L02412 Cutaneous abscess of left axilla: Secondary | ICD-10-CM | POA: Diagnosis not present

## 2023-04-29 DIAGNOSIS — R739 Hyperglycemia, unspecified: Secondary | ICD-10-CM | POA: Diagnosis not present

## 2023-04-29 DIAGNOSIS — C801 Malignant (primary) neoplasm, unspecified: Secondary | ICD-10-CM | POA: Diagnosis not present

## 2023-04-29 LAB — CBC
HCT: 34.7 % — ABNORMAL LOW (ref 39.0–52.0)
Hemoglobin: 10.9 g/dL — ABNORMAL LOW (ref 13.0–17.0)
MCH: 27.8 pg (ref 26.0–34.0)
MCHC: 31.4 g/dL (ref 30.0–36.0)
MCV: 88.5 fL (ref 80.0–100.0)
Platelets: 270 10*3/uL (ref 150–400)
RBC: 3.92 MIL/uL — ABNORMAL LOW (ref 4.22–5.81)
RDW: 14.2 % (ref 11.5–15.5)
WBC: 9.6 10*3/uL (ref 4.0–10.5)
nRBC: 0 % (ref 0.0–0.2)

## 2023-04-29 LAB — GLUCOSE, CAPILLARY: Glucose-Capillary: 235 mg/dL — ABNORMAL HIGH (ref 70–99)

## 2023-04-29 MED ORDER — DEXCOM G7 SENSOR MISC: 0 refills | Status: AC

## 2023-04-29 MED ORDER — INSULIN GLARGINE-YFGN 100 UNIT/ML ~~LOC~~ SOLN
20.0000 [IU] | Freq: Every day | SUBCUTANEOUS | Status: DC
  Administered 2023-04-29: 20 [IU] via SUBCUTANEOUS
  Filled 2023-04-29 (×2): qty 0.2

## 2023-04-29 MED ORDER — BASAGLAR KWIKPEN 100 UNIT/ML ~~LOC~~ SOPN: 20.0000 [IU] | PEN_INJECTOR | Freq: Every day | SUBCUTANEOUS | 1 refills | Status: AC

## 2023-04-29 MED ORDER — SULFAMETHOXAZOLE-TRIMETHOPRIM 800-160 MG PO TABS: 2.0000 | ORAL_TABLET | Freq: Two times a day (BID) | ORAL | 0 refills | Status: DC

## 2023-04-29 MED ORDER — INSULIN PEN NEEDLE 32G X 4 MM MISC: 1 refills | Status: AC

## 2023-04-29 MED ORDER — NOVOLOG FLEXPEN 100 UNIT/ML ~~LOC~~ SOPN: 6.0000 [IU] | PEN_INJECTOR | Freq: Three times a day (TID) | SUBCUTANEOUS | 1 refills | Status: AC

## 2023-04-29 MED ORDER — DEXCOM G7 RECEIVER DEVI: 0 refills | Status: AC

## 2023-04-29 NOTE — TOC Transition Note (Signed)
Transition of Care [facility]) - CM/SW Discharge Note   Patient Details  Name: Jose Melendez MRN: [id number] Date of Birth: 1970/11/16  Transition of Care [facility]) CM/SW Contact:  Karn Cassis, LCSW Phone Number: 04/29/2023, 8:44 AM   Clinical Narrative: Pt d/c today. Haley with [facility] notified. MD aware HHRN order needed.       Final next level of care: Home w Home Health Services Barriers to Discharge: Continued Medical Work up   Patient Goals and CMS Choice   Choice offered to / list presented to : Patient  Discharge Placement                         Discharge Plan and Services Additional resources added to the After Visit Summary for   In-house Referral: Clinical Social Work                        HH Arranged: RN HH Agency: [facility] Date [facility] Agency Contacted: 04/28/23 Time HH Agency Contacted: 1355 Representative spoke with at [facility] Agency: Rolly Salter  Social Determinants of Health (SDOH) Interventions SDOH Screenings   Food Insecurity: No Food Insecurity (04/25/2023)  Housing: Low Risk  (04/25/2023)  Transportation Needs: No Transportation Needs (04/25/2023)  Utilities: Not At Risk (04/25/2023)  Depression (PHQ2-9): Low Risk  (06/03/2022)  Financial Resource Strain: Low Risk  (03/27/2021)   Received from [facility], [facility]  Physical Activity: Sufficiently Active (03/20/2021)   Received from [facility], [facility] Care  Recent Concern: Physical Activity - Insufficiently Active (01/13/2021)   Received from [facility] visits prior to 07/19/2022., [facility] visits prior to 07/19/2022.  Social Connections: Moderately Integrated (03/20/2021)   Received from [facility], [facility]  Recent Concern: Social Connections - Moderately Isolated (01/13/2021)   Received from [facility] visits prior to 07/19/2022., [facility] visits prior to 07/19/2022.   Stress: No Stress Concern Present (03/27/2021)   Received from [facility], [facility]  Tobacco Use: High Risk (04/25/2023)  Health Literacy: Low Risk  (03/27/2021)   Received from [facility], [facility] Care     Readmission Risk Interventions     No data to display

## 2023-04-29 NOTE — Plan of Care (Signed)
  Problem: Health Behavior/Discharge Planning: Goal: Ability to manage health-related needs will improve Outcome: Progressing   Problem: Clinical Measurements: Goal: Ability to maintain clinical measurements within normal limits will improve Outcome: Progressing Goal: Will remain free from infection Outcome: Progressing Goal: Diagnostic test results will improve Outcome: Progressing Goal: Cardiovascular complication will be avoided Outcome: Progressing   Problem: Activity: Goal: Risk for activity intolerance will decrease Outcome: Progressing   Problem: Pain Management: Goal: General experience of comfort will improve Outcome: Progressing   Problem: Skin Integrity: Goal: Risk for impaired skin integrity will decrease Outcome: Progressing   Problem: Metabolic: Goal: Ability to maintain appropriate glucose levels will improve Outcome: Progressing   Problem: Skin Integrity: Goal: Risk for impaired skin integrity will decrease Outcome: Progressing

## 2023-04-29 NOTE — Discharge Summary (Signed)
Physician Discharge Summary   Patient: Jose Melendez MRN: [id number] DOB: 19-Jul-1970  Admit date:     04/25/2023  Discharge date: 04/29/23  Discharge Physician: Onalee Hua Gearold Wainer   PCP: Lovey Newcomer, PA   Recommendations at discharge:   Please follow up with primary care provider within 1-2 weeks  Please repeat BMP and CBC in one week    Hospital Course:  Jose Melendez  is a 52 y.o. male, with past medical history of morbid obesity, diabetes mellitus, type II, adenocarcinoma stage II, s/p resection, chemotherapy and with colostomy pulmonary nodules, he is following with Dr. Ellin Saba. -Presents to ED secondary to worsening abscess in the left axilla, reports abscess started a week ago, he went to urgent care where he was started on Cipro, and had referral to surgery, reports he did follow-up with surgery and they referred him to ED, patient reports some chills, fever at home, chest pain, no shortness of breath. -In ED CT chest was obtained which did show large related abscess in the left axilla, and stable pulmonary nodules, patient with elevated lactic acid at 3.1,With leukocytosis at 15.6 pseudohyponatremia of 127 (due to hyperglycemia), his blood glucose was 642, patient had bedside I&D by ED provider with 600 cc of purulent output drained, please see pictures below, [facility] hospitalist consulted to admit.  Unfortunately, the patient's abscess reaccumulated.  His white blood cell count remained elevated.  General surgery was consulted.  Dr. Henreitta Leber saw the patient and took him to the OR for I&D on 04/27/2023.  Previous cultures already grew MRSA.  The patient was continued on IV vancomycin.  His WBC gradually improved and was 9.6 on the day of discharge.  Detailed instructions were given to the patient for management of his JP drains.  The patient was seen by diabetes nursing coordinator.  The patient was started on insulin for hemoglobin A1c of 14.7.  Instructions were given for self administration of  insulin.  The patient was given instructions for Dexcom G7 glucose monitoring.  The patient expressed understanding.  The patient will be discharged home with Bactrim DS, 2 tabs twice daily for additional 7 days.  He will follow-up with Dr. Henreitta Leber in the office on 05/05/2023.  Home health was set up for his wound care.  Assessment and Plan: severe sepsis secondary to left axillary abscess -Status post I&D 04/27/23 still with significant ongoing purulent discharge/output. -Appreciate Dr. Henreitta Leber -Status post surgical debridement and JP placement -continued IV vanco during hospitalization -04/25/23 culture = MRSA -Continue supportive care,/wound care -d/c home with Bactrim DS, 2 tabs bid x 7 more days   colon adenocarcinoma--in remission -Status post surgical resection (sigmoid colectomy) with colostomy in place -follow recommendations by wound care service as part of colostomy care. -Continue patient follow-up with Dr. Ellin Saba   uncontrolled type 2 diabetes mellitus with hyperglycemia -Holding oral hypoglycemic agents while inpatient -Continue sliding scale insulin and Semglee. -04/25/23 A1c above 14.7 -Will require treatment with insulin at discharge -Diabetes coordinator has been consulted to assist with teaching and recommendations regarding insulin therapy at discharge. -d/c home with basaglar and novolog with meals   neuropathy associated with cancer/diabetes -Continue gabapentin. -PDMP reviewed -pt on percocet 7.5/325, last refill 04/02/23   morbid obesity -Body mass index is 50.63 kg/m.  -Low-calorie diet, portion control and increasing activity discussed with patient.   pulmonary nodules -Appears to be stable on repeat CT scan at time of admission -Continue to follow-up with oncology service.   hyponatremia (pseudohyponatremia) the setting  of hyperglycemia -Corrected sodium appears to be in the normal range -Maintain adequate hydration and follow electrolytes  trend.     Consultants: general surgery Procedures performed: none  Disposition: Home Diet recommendation:  Carb modified diet DISCHARGE MEDICATION: Allergies as of 04/29/2023   No Known Allergies      Medication List     TAKE these medications    Basaglar KwikPen 100 UNIT/ML Inject 20 Units into the skin daily. Start taking on: April 30, 2023   [facility] G7 Receiver Hardie Pulley Use as directed to monitor sugars   Dexcom G7 Sensor Misc Use to monitor sugars as directed   Insulin Pen Needle 32G X 4 MM Misc Use with insulin pens to dispense insulin as directed   metFORMIN 500 MG 24 hr tablet Commonly known as: GLUCOPHAGE-XR Take 500 mg by mouth daily with breakfast.   NovoLOG FlexPen 100 UNIT/ML FlexPen Generic drug: insulin aspart Inject 6 Units into the skin 3 (three) times daily with meals.   oxyCODONE-acetaminophen 7.5-325 MG tablet Commonly known as: PERCOCET Take 1 tablet by mouth every 8 (eight) hours as needed for severe pain (pain score 7-10).   sulfamethoxazole-trimethoprim 800-160 MG tablet Commonly known as: BACTRIM DS Take 2 tablets by mouth 2 (two) times daily.        Follow-up Information     [facility] Follow up.   Why: Will contact you to schedule home health visits.               Discharge Exam: Filed Weights   04/25/23 1131  Weight: (!) 151 kg   HEENT:  [place]/AT, No thrush, no icterus CV:  RRR, no rub, no S3, no S4 Lung:  CTA, no wheeze, no rhonchi Abd:  soft/+BS, NT Ext:  No edema, no lymphangitis, no synovitis, no rash   Condition at discharge: stable  The results of significant diagnostics from this hospitalization (including imaging, microbiology, ancillary and laboratory) are listed below for reference.   Imaging Studies: CT Chest W Contrast  Result Date: 04/25/2023 CLINICAL DATA:  Colon cancer, assess treatment response. History of pulmonary nodule suspicious for metastatic disease. Large abscess under arm. *  Tracking Code: BO * EXAM: CT CHEST WITH CONTRAST TECHNIQUE: Multidetector CT imaging of the chest was performed during intravenous contrast administration. RADIATION DOSE REDUCTION: This exam was performed according to the departmental dose-optimization program which includes automated exposure control, adjustment of the mA and/or kV according to patient size and/or use of iterative reconstruction technique. CONTRAST:  80mL OMNIPAQUE IOHEXOL 300 MG/ML  SOLN COMPARISON:  Chest CT 11/21/2022. No other relevant comparison studies are available for direct correlation. Report from PET-CT 04/07/2022, images unavailable. FINDINGS: Cardiovascular: No acute vascular findings are identified. Right IJ Port-A-Cath extends to the lower SVC. There is mild coronary artery atherosclerosis. The heart size is normal. There is no pericardial effusion. Mediastinum/Nodes: There is a new large multilobulated mass or complex fluid collection within the left axilla, measuring approximately 12.6 x 7.4 x 10.6 cm. This extends into the lateral aspect of the breast and is incompletely visualized. No associated calcifications or surrounding soft tissue emphysema. Apart from this mass, no enlarged lymph nodes are identified within the mediastinum, hilar or axillary regions. The thyroid gland, trachea and esophagus demonstrate no significant findings. Lungs/Pleura: No pleural effusion or pneumothorax. There is mild centrilobular emphysema. Scattered small pulmonary nodules are similar to the previous CT, largest in the lingula, measuring 9 x 5 mm on image 105/4. This nodule appears triangular on the  reformatted images and may be associated with an accessory fissure. The morphology of this nodule is benign. A 5 mm left upper lobe nodule on image 88/4 appears slightly larger. No other enlarging or new nodules are identified. Upper abdomen: Diffuse hepatic steatosis and probable hepatomegaly. No focal abnormalities are identified.  Musculoskeletal/Chest wall: No acute osseous findings or evidence of osseous metastatic disease. Mild paraspinal osteophytes. As above, incompletely visualized left axillary mass or complex fluid collection. IMPRESSION: 1. Similar appearance of scattered small pulmonary nodules compared with available prior chest CT from 5 months ago. Based on prior reports, these may reflect treated metastatic disease. A single left upper lobe nodule measuring 5 mm appears slightly larger. No other significant change identified. 2. New large multilobulated mass or complex fluid collection within the left axilla, incompletely visualized. This is new from July and is compatible with the given history of abscess. Correlate clinically. This could be further evaluated with ultrasound to assess for drainable components, if clinically warranted. 3. No other evidence of metastatic disease within the chest. 4. Diffuse hepatic steatosis and probable hepatomegaly. 5. Aortic Atherosclerosis (ICD10-I70.0) and Emphysema (ICD10-J43.9). Electronically Signed   By: Carey Bullocks M.D.   On: 04/25/2023 13:35    Microbiology: Results for orders placed or performed during the hospital encounter of 04/25/23  Blood culture (routine x 2)     Status: None (Preliminary result)   Collection Time: 04/25/23 11:44 AM   Specimen: BLOOD  Result Value Ref Range Status   Specimen Description BLOOD RIGHT ANTECUBITAL  Final   Special Requests   Final    Blood Culture results may not be optimal due to an inadequate volume of blood received in culture bottles BOTTLES DRAWN AEROBIC AND ANAEROBIC   Culture   Final    NO GROWTH 4 DAYS Performed at [facility], [address], [place], [place] [address]    Report Status PENDING  Incomplete  Blood culture (routine x 2)     Status: None (Preliminary result)   Collection Time: 04/25/23 12:32 PM   Specimen: BLOOD  Result Value Ref Range Status   Specimen Description BLOOD LEFT ANTECUBITAL  Final    Special Requests   Final    Blood Culture adequate volume BOTTLES DRAWN AEROBIC AND ANAEROBIC Performed at [facility], [address] [place], [place] [address]    Culture PENDING  Incomplete   Report Status PENDING  Incomplete  Aerobic/Anaerobic Culture w Gram Stain (surgical/deep wound)     Status: None (Preliminary result)   Collection Time: 04/25/23  4:05 PM   Specimen: Axilla  Result Value Ref Range Status   Specimen Description   Final    AXILLA Performed at [facility], [address] [place], [place] [address]    Special Requests   Final    Immunocompromised Performed at [facility], [address] [place], [place] [address]    Gram Stain   Final    RARE WBC PRESENT, PREDOMINANTLY MONONUCLEAR FEW GRAM POSITIVE COCCI IN CLUSTERS Performed at [facility], [address], [place], [place] [address]    Culture   Final    MODERATE METHICILLIN RESISTANT STAPHYLOCOCCUS AUREUS NO ANAEROBES ISOLATED; CULTURE IN PROGRESS FOR 5 DAYS    Report Status PENDING  Incomplete   Organism ID, Bacteria METHICILLIN RESISTANT STAPHYLOCOCCUS AUREUS  Final      Susceptibility   Methicillin resistant staphylococcus aureus - MIC*    CIPROFLOXACIN >=8 RESISTANT Resistant     ERYTHROMYCIN 0.5 SENSITIVE Sensitive  GENTAMICIN <=0.5 SENSITIVE Sensitive     OXACILLIN >=4 RESISTANT Resistant     TETRACYCLINE <=1 SENSITIVE Sensitive     VANCOMYCIN 1 SENSITIVE Sensitive     TRIMETH/SULFA 20 SENSITIVE Sensitive     CLINDAMYCIN <=0.25 SENSITIVE Sensitive     RIFAMPIN <=0.5 SENSITIVE Sensitive     Inducible Clindamycin NEGATIVE Sensitive     LINEZOLID 2 SENSITIVE Sensitive     * MODERATE METHICILLIN RESISTANT STAPHYLOCOCCUS AUREUS    Labs: CBC: Recent Labs  Lab 04/25/23 1144 04/25/23 1953 04/26/23 0505 04/29/23 0437  WBC 15.6* 17.5* 15.4* 9.6  NEUTROABS 12.5*  --   --   --   HGB 13.0 12.6* 11.8* 10.9*  HCT 40.4 39.2 38.0* 34.7*  MCV 88.0 87.9 88.6 88.5  PLT 255 279 265 270    Basic Metabolic Panel: Recent Labs  Lab 04/25/23 1144 04/26/23 0505 04/28/23 1849  NA 127* 129* 132*  K 4.2 3.7 4.0  CL 90* 94* 94*  CO2 26 26 30   GLUCOSE 642* 274* 273*  BUN 9 8 10   CREATININE 0.71 0.67 0.72  CALCIUM 8.6* 7.9* 8.4*   Liver Function Tests: Recent Labs  Lab 04/25/23 1144  AST 19  ALT 19  ALKPHOS 101  BILITOT 0.3  PROT 6.3*  ALBUMIN 2.6*   CBG: Recent Labs  Lab 04/28/23 0747 04/28/23 1141 04/28/23 1628 04/28/23 2138 04/29/23 0747  GLUCAP 250* 268* 211* 207* 235*    Discharge time spent: greater than 30 minutes.  Signed: Catarina Hartshorn, MD [facility] 04/29/2023

## 2023-04-29 NOTE — Progress Notes (Signed)
Pt instructed on flushing and emptying of JP drain. Pt able to give satisfactory return demonstration. Supplies needed for flush, dressing change, emptying, etc., given to pt and significant other.  Dressing changed on left axilla. Site clean, dry, no drainage. New dressing applied without difficulty.

## 2023-04-29 NOTE — Inpatient Diabetes Management (Signed)
Inpatient Diabetes Program Recommendations  AACE/ADA: New Consensus Statement on Inpatient Glycemic Control   Target Ranges:  Prepandial:   less than 140 mg/dL      Peak postprandial:   less than 180 mg/dL (1-2 hours)      Critically ill patients:  140 - 180 mg/dL    Latest Reference Range & Units 04/28/23 07:47 04/28/23 11:41 04/28/23 16:28 04/28/23 21:38  Glucose-Capillary 70 - 99 mg/dL 409 (H) 811 (H) 914 (H) 207 (H)   Review of Glycemic Control  Diabetes history: DM2 Outpatient Diabetes medications: Metformin 500 mg QAM Current orders for Inpatient glycemic control: Semglee 20 units daily, Novolog 0-20 units TID with meals, Novolog 0-5 units QHS   Inpatient Diabetes Program Recommendations:     Insulin: Received Semglee 12 units on 12/10 and will receive 20 units today.  Please consider adding Novolog 5 units TID with meals for meal coverage if patient eats at least 50% of meals.   HbgA1C:  A1C 14.7% on 04/25/23 indicating an average glucose of 375 mg/dl over the past 2-3 months.  Outpatient DM: If patient is discharged on insulin, insurance covers Hospital doctor and Novolog insulin pens. Please provide Rx for glucose monitoring kit (#[id number]), Basaglar pens [phone]), Novolog pens [phone]), insulin pen needles (#[id number]), Dexcom G7 sensors (#[id number]) and Dexcom G7 reader (#[id number]).   Thanks, Orlando Penner, RN, MSN, CDCES Diabetes Coordinator Inpatient Diabetes Program [phone] (Team Pager from 8am to 5pm)

## 2023-04-30 ENCOUNTER — Other Ambulatory Visit: Payer: Self-pay | Admitting: *Deleted

## 2023-04-30 ENCOUNTER — Telehealth: Payer: Self-pay | Admitting: *Deleted

## 2023-04-30 DIAGNOSIS — R918 Other nonspecific abnormal finding of lung field: Secondary | ICD-10-CM | POA: Diagnosis not present

## 2023-04-30 DIAGNOSIS — Z7984 Long term (current) use of oral hypoglycemic drugs: Secondary | ICD-10-CM | POA: Diagnosis not present

## 2023-04-30 DIAGNOSIS — Z85038 Personal history of other malignant neoplasm of large intestine: Secondary | ICD-10-CM | POA: Diagnosis not present

## 2023-04-30 DIAGNOSIS — Z51A Encounter for sepsis aftercare: Secondary | ICD-10-CM | POA: Diagnosis not present

## 2023-04-30 DIAGNOSIS — G130 Paraneoplastic neuromyopathy and neuropathy: Secondary | ICD-10-CM | POA: Diagnosis not present

## 2023-04-30 DIAGNOSIS — L02412 Cutaneous abscess of left axilla: Secondary | ICD-10-CM | POA: Diagnosis not present

## 2023-04-30 DIAGNOSIS — Z794 Long term (current) use of insulin: Secondary | ICD-10-CM | POA: Diagnosis not present

## 2023-04-30 DIAGNOSIS — C187 Malignant neoplasm of sigmoid colon: Secondary | ICD-10-CM | POA: Diagnosis not present

## 2023-04-30 DIAGNOSIS — E1165 Type 2 diabetes mellitus with hyperglycemia: Secondary | ICD-10-CM | POA: Diagnosis not present

## 2023-04-30 LAB — CULTURE, BLOOD (ROUTINE X 2)

## 2023-04-30 LAB — AEROBIC/ANAEROBIC CULTURE W GRAM STAIN (SURGICAL/DEEP WOUND)

## 2023-04-30 MED ORDER — OXYCODONE-ACETAMINOPHEN 7.5-325 MG PO TABS: 1.0000 | ORAL_TABLET | Freq: Three times a day (TID) | ORAL | 0 refills | Status: DC | PRN

## 2023-04-30 NOTE — Telephone Encounter (Signed)
Received call from [place], [facility] SN with [facility].   Requested verbal orders for wound care to left axilla and drain care per hospital discharge. Visit frequency as follows: 1 visit x1 week, then 2 visits x1 week, then 1 visit x6 weeks.   Verbal orders given.

## 2023-05-05 ENCOUNTER — Telehealth: Payer: Self-pay | Admitting: *Deleted

## 2023-05-05 ENCOUNTER — Ambulatory Visit (INDEPENDENT_AMBULATORY_CARE_PROVIDER_SITE_OTHER): Payer: 59 | Admitting: General Surgery

## 2023-05-05 ENCOUNTER — Encounter: Payer: Self-pay | Admitting: General Surgery

## 2023-05-05 VITALS — BP 142/84 | HR 97 | Temp 97.8°F | Resp 16 | Ht 68.0 in | Wt 338.0 lb

## 2023-05-05 DIAGNOSIS — L02412 Cutaneous abscess of left axilla: Secondary | ICD-10-CM

## 2023-05-05 NOTE — Telephone Encounter (Signed)
Received call from [facility], [facility] SN with [facility]. [phone]~ telephone.   Reports that patient was seen at OV today and feels that he and his caregiver can adequately perform wound care to axilla. Reports that patient declined [facility] services. States that if more extensive wound care is required, he will call [facility] office back.

## 2023-05-05 NOTE — Patient Instructions (Signed)
Continue drain care. Record output daily Complete antibiotics on Thursday.  Call with issues or concerns.

## 2023-05-07 NOTE — Progress Notes (Signed)
[facility]  Doing well. JP drain being flushed is not draining at all. The other JP is putting out about 20cc of SS fluid. Swelling and pain much improved.  BP (!) 142/84   Pulse 97   Temp 97.8 F (36.6 C) (Oral)   Resp 16   Ht 5\' 8"  (1.727 m)   Wt (!) 338 lb (153.3 kg)   SpO2 90%   BMI 51.39 kg/m  Left axilla softer and no erythema, JP with flushing stop cock removed, more posterior drain left in place to bulb suction  Patient doing well. No complaints after I*D of left axillary abscess and drain placement. MRSA on cultures and has been on bactrim.   Continue drain care. Record output daily Complete antibiotics on Thursday.  Call with issues or concerns.   Future Appointments  Date Time Provider Department Center  05/12/2023  9:45 AM Lucretia Roers, MD [facility] None  06/23/2023  3:00 PM AP-[facility] LAB [facility] None  06/23/2023  4:00 PM AP-CT 1 AP-CT [place] [facility]  07/01/2023  2:00 PM Doreatha Massed, MD [facility] None  07/01/2023  3:00 PM AP-AC[facility] NURSE [facility] None     Algis Greenhouse, MD [facility] [address] [place] [place], [place] [address] [phone] (office)

## 2023-05-12 ENCOUNTER — Ambulatory Visit (INDEPENDENT_AMBULATORY_CARE_PROVIDER_SITE_OTHER): Payer: 59 | Admitting: General Surgery

## 2023-05-12 VITALS — BP 143/88 | HR 96 | Temp 97.8°F | Resp 18 | Ht 68.0 in | Wt 346.0 lb

## 2023-05-12 DIAGNOSIS — L02412 Cutaneous abscess of left axilla: Secondary | ICD-10-CM

## 2023-05-12 LAB — CULTURE, BLOOD (ROUTINE X 2)
Culture: NO GROWTH
Special Requests: ADEQUATE

## 2023-05-12 NOTE — Progress Notes (Signed)
[facility]  Drain with about 10cc for two days but looks tannish cloudy. No pain or tenderness reported. No fevers.  Drainage about been SS in nature.  BP (!) 143/88   Pulse 96   Temp 97.8 F (36.6 C) (Oral)   Resp 18   Ht 5\' 8"  (1.727 m)   Wt (!) 346 lb (156.9 kg)   SpO2 92%   BMI 52.61 kg/m  Cloudy tan looking drainage, dressing replaced, sutures removed from prior drain site, replaced bandage  Patient s/p I&D of abscess of the left axilla. One drain out but the drainage now is purulent looking in the remaining drain. Will leave in.   Restart his bactrim he has at home from his PCP prior to admission. Can do for 5 days.   Keep area covered and drain the jp daily. Keep up with the color daily. Take antibiotics for the next 5 days (the supply you have).  Call with issues.   Future Appointments  Date Time Provider Department Center  05/18/2023  9:45 AM Lucretia Roers, MD RS-RS None  06/23/2023  3:00 PM AP[facility] LAB [facility] None  06/23/2023  4:00 PM AP-CT 1 AP-CT [place] [facility]  07/01/2023  2:00 PM Doreatha Massed, MD [facility] None  07/01/2023  3:00 PM AP-AC[facility] NURSE [facility] None   Algis Greenhouse, MD [facility] [address] [place], [place] [address] [phone] (office)

## 2023-05-12 NOTE — Patient Instructions (Addendum)
Keep area covered and drain the jp daily. Keep up with the color daily. Take antibiotics for the next 5 days (the supply you have).  Call with issues.

## 2023-05-18 ENCOUNTER — Encounter: Payer: Self-pay | Admitting: General Surgery

## 2023-05-18 ENCOUNTER — Other Ambulatory Visit: Payer: Self-pay

## 2023-05-18 ENCOUNTER — Ambulatory Visit (INDEPENDENT_AMBULATORY_CARE_PROVIDER_SITE_OTHER): Payer: 59 | Admitting: General Surgery

## 2023-05-18 VITALS — BP 124/86 | HR 99 | Temp 98.2°F | Resp 22 | Ht 68.0 in | Wt 346.0 lb

## 2023-05-18 DIAGNOSIS — L02412 Cutaneous abscess of left axilla: Secondary | ICD-10-CM

## 2023-05-18 NOTE — Patient Instructions (Signed)
Call with any issues Finish antibiotic.

## 2023-05-18 NOTE — Progress Notes (Signed)
[facility]  Doing better, <25 cc out total. Less cloudy. Feeling better. No major issues.  BP 124/86   Pulse 99   Temp 98.2 F (36.8 C) (Oral)   Resp (!) 22   Ht 5\' 8"  (1.727 m)   Wt (!) 346 lb (156.9 kg)   SpO2 90%   BMI 52.61 kg/m  Drain with minimal in drain, tubing with serous fluid Removed, gauze bandage placed   Patient s/p I&D of left axilla abscess. Last drain out.   Call with any issues Finish antibiotic.   Algis Greenhouse, MD [facility] [address] [place], [place] [address] [phone] (office)

## 2023-05-28 ENCOUNTER — Ambulatory Visit (INDEPENDENT_AMBULATORY_CARE_PROVIDER_SITE_OTHER): Payer: 59 | Admitting: General Surgery

## 2023-05-28 ENCOUNTER — Encounter: Payer: Self-pay | Admitting: General Surgery

## 2023-05-28 VITALS — BP 136/89 | HR 87 | Temp 98.0°F | Resp 18 | Ht 68.0 in | Wt 352.0 lb

## 2023-05-28 DIAGNOSIS — L02412 Cutaneous abscess of left axilla: Secondary | ICD-10-CM

## 2023-06-01 ENCOUNTER — Other Ambulatory Visit: Payer: Self-pay | Admitting: *Deleted

## 2023-06-01 MED ORDER — OXYCODONE-ACETAMINOPHEN 7.5-325 MG PO TABS: 1.0000 | ORAL_TABLET | Freq: Three times a day (TID) | ORAL | 0 refills | Status: DC | PRN

## 2023-06-01 NOTE — Patient Instructions (Signed)
 Monitor. Finish your antibiotics you have. Call with issues.

## 2023-06-01 NOTE — Progress Notes (Signed)
 [facility]  Felt like he had some drainage from the axilla after his dressing was removed. Wanted to get checked. He had some bactrim  from his PCP and is taking that.   No fevers and no pain or swelling.  BP 136/89   Pulse 87   Temp 98 F (36.7 C) (Oral)   Resp 18   Ht 5' 8 (1.727 m)   Wt (!) 352 lb (159.7 kg)   SpO2 93%   BMI 53.52 kg/m  Left axilla with no erythema or swelling, US  at bedside used and no large pockets or abscess noted, no signs of drainage  Patient s/p left axillary abscess drainage with drains, drains out now.  Monitor. Finish your antibiotics you have. Call with issues.   Manuelita Pander, MD [facility] [address] [address], [place] [address] [phone] (office)

## 2023-06-22 ENCOUNTER — Other Ambulatory Visit: Payer: Self-pay

## 2023-06-22 DIAGNOSIS — C187 Malignant neoplasm of sigmoid colon: Secondary | ICD-10-CM

## 2023-06-23 ENCOUNTER — Other Ambulatory Visit: Payer: 59

## 2023-06-23 ENCOUNTER — Inpatient Hospital Stay: Payer: 59 | Attending: Hematology

## 2023-06-23 ENCOUNTER — Ambulatory Visit (HOSPITAL_COMMUNITY): Admission: RE | Admit: 2023-06-23 | Payer: 59 | Source: Ambulatory Visit

## 2023-06-23 DIAGNOSIS — T451X5A Adverse effect of antineoplastic and immunosuppressive drugs, initial encounter: Secondary | ICD-10-CM | POA: Insufficient documentation

## 2023-06-23 DIAGNOSIS — K760 Fatty (change of) liver, not elsewhere classified: Secondary | ICD-10-CM | POA: Insufficient documentation

## 2023-06-23 DIAGNOSIS — R918 Other nonspecific abnormal finding of lung field: Secondary | ICD-10-CM | POA: Diagnosis not present

## 2023-06-23 DIAGNOSIS — F17210 Nicotine dependence, cigarettes, uncomplicated: Secondary | ICD-10-CM | POA: Insufficient documentation

## 2023-06-23 DIAGNOSIS — K435 Parastomal hernia without obstruction or  gangrene: Secondary | ICD-10-CM | POA: Insufficient documentation

## 2023-06-23 DIAGNOSIS — K828 Other specified diseases of gallbladder: Secondary | ICD-10-CM | POA: Insufficient documentation

## 2023-06-23 DIAGNOSIS — Z79899 Other long term (current) drug therapy: Secondary | ICD-10-CM | POA: Insufficient documentation

## 2023-06-23 DIAGNOSIS — Z933 Colostomy status: Secondary | ICD-10-CM | POA: Insufficient documentation

## 2023-06-23 DIAGNOSIS — C187 Malignant neoplasm of sigmoid colon: Secondary | ICD-10-CM | POA: Insufficient documentation

## 2023-06-23 DIAGNOSIS — Z9049 Acquired absence of other specified parts of digestive tract: Secondary | ICD-10-CM | POA: Insufficient documentation

## 2023-06-23 DIAGNOSIS — K0889 Other specified disorders of teeth and supporting structures: Secondary | ICD-10-CM | POA: Diagnosis not present

## 2023-06-23 DIAGNOSIS — G620 Drug-induced polyneuropathy: Secondary | ICD-10-CM | POA: Insufficient documentation

## 2023-06-23 DIAGNOSIS — R160 Hepatomegaly, not elsewhere classified: Secondary | ICD-10-CM | POA: Insufficient documentation

## 2023-06-23 LAB — COMPREHENSIVE METABOLIC PANEL
ALT: 20 U/L (ref 0–44)
AST: 24 U/L (ref 15–41)
Albumin: 3.3 g/dL — ABNORMAL LOW (ref 3.5–5.0)
Alkaline Phosphatase: 80 U/L (ref 38–126)
Anion gap: 10 (ref 5–15)
BUN: 9 mg/dL (ref 6–20)
CO2: 28 mmol/L (ref 22–32)
Calcium: 9 mg/dL (ref 8.9–10.3)
Chloride: 94 mmol/L — ABNORMAL LOW (ref 98–111)
Creatinine, Ser: 0.72 mg/dL (ref 0.61–1.24)
GFR, Estimated: >60 mL/min (ref >60)
Glucose, Bld: 311 mg/dL — ABNORMAL HIGH (ref 70–99)
Potassium: 4.1 mmol/L (ref 3.5–5.1)
Sodium: 132 mmol/L — ABNORMAL LOW (ref 135–145)
Total Bilirubin: 0.5 mg/dL (ref 0.0–1.2)
Total Protein: 6.7 g/dL (ref 6.5–8.1)

## 2023-06-23 LAB — CBC WITH DIFFERENTIAL/PLATELET
Abs Immature Granulocytes: 0.06 10*3/uL (ref 0.00–0.07)
Basophils Absolute: 0 10*3/uL (ref 0.0–0.1)
Basophils Relative: 0 %
Eosinophils Absolute: 0.2 10*3/uL (ref 0.0–0.5)
Eosinophils Relative: 2 %
HCT: 40.1 % (ref 39.0–52.0)
Hemoglobin: 13 g/dL (ref 13.0–17.0)
Immature Granulocytes: 1 %
Lymphocytes Relative: 20 %
Lymphs Abs: 2.2 10*3/uL (ref 0.7–4.0)
MCH: 27.7 pg (ref 26.0–34.0)
MCHC: 32.4 g/dL (ref 30.0–36.0)
MCV: 85.5 fL (ref 80.0–100.0)
Monocytes Absolute: 0.6 10*3/uL (ref 0.1–1.0)
Monocytes Relative: 5 %
Neutro Abs: 8.1 10*3/uL — ABNORMAL HIGH (ref 1.7–7.7)
Neutrophils Relative %: 72 %
Platelets: 280 10*3/uL (ref 150–400)
RBC: 4.69 MIL/uL (ref 4.22–5.81)
RDW: 14.9 % (ref 11.5–15.5)
WBC: 11.2 10*3/uL — ABNORMAL HIGH (ref 4.0–10.5)
nRBC: 0 % (ref 0.0–0.2)

## 2023-06-24 LAB — CEA: CEA: 5.7 ng/mL — ABNORMAL HIGH (ref 0.0–4.7)

## 2023-06-29 ENCOUNTER — Other Ambulatory Visit: Payer: Self-pay | Admitting: *Deleted

## 2023-06-29 MED ORDER — OXYCODONE-ACETAMINOPHEN 7.5-325 MG PO TABS: 1.0000 | ORAL_TABLET | Freq: Three times a day (TID) | ORAL | 0 refills | Status: DC | PRN

## 2023-06-30 ENCOUNTER — Ambulatory Visit (HOSPITAL_COMMUNITY): Payer: 59

## 2023-07-01 ENCOUNTER — Ambulatory Visit: Payer: 59 | Admitting: Hematology

## 2023-07-01 ENCOUNTER — Inpatient Hospital Stay: Payer: 59 | Admitting: Hematology

## 2023-07-01 ENCOUNTER — Inpatient Hospital Stay: Payer: 59

## 2023-07-03 ENCOUNTER — Encounter (INDEPENDENT_AMBULATORY_CARE_PROVIDER_SITE_OTHER): Payer: Self-pay | Admitting: *Deleted

## 2023-07-07 ENCOUNTER — Inpatient Hospital Stay: Payer: 59 | Admitting: Hematology

## 2023-07-07 ENCOUNTER — Inpatient Hospital Stay: Payer: 59

## 2023-07-10 ENCOUNTER — Ambulatory Visit (HOSPITAL_COMMUNITY)
Admission: RE | Admit: 2023-07-10 | Discharge: 2023-07-10 | Disposition: A | Discharge status: Home / Self Care | Payer: 59 | Source: Ambulatory Visit | Attending: Hematology | Admitting: Hematology

## 2023-07-10 ENCOUNTER — Inpatient Hospital Stay: Payer: 59

## 2023-07-10 DIAGNOSIS — C187 Malignant neoplasm of sigmoid colon: Secondary | ICD-10-CM | POA: Insufficient documentation

## 2023-07-10 MED ORDER — SODIUM CHLORIDE 0.9% FLUSH
10.0000 mL | Freq: Once | INTRAVENOUS | Status: AC
  Administered 2023-07-10: 10 mL via INTRAVENOUS

## 2023-07-10 MED ORDER — IOHEXOL 350 MG/ML SOLN: 75.0000 mL | Freq: Once | INTRAVENOUS | Status: DC | PRN

## 2023-07-10 MED ORDER — HEPARIN SOD (PORK) LOCK FLUSH 100 UNIT/ML IV SOLN
500.0000 [IU] | Freq: Once | INTRAVENOUS | Status: AC
  Administered 2023-07-10: 500 [IU] via INTRAVENOUS

## 2023-07-10 MED ORDER — IOHEXOL 350 MG/ML SOLN
75.0000 mL | Freq: Once | INTRAVENOUS | Status: AC | PRN
  Administered 2023-07-10: 75 mL via INTRAVENOUS

## 2023-07-10 MED ORDER — HEPARIN SOD (PORK) LOCK FLUSH 100 UNIT/ML IV SOLN
INTRAVENOUS | Status: AC
Start: 2023-07-10 — End: ?
  Filled 2023-07-10: qty 5

## 2023-07-10 NOTE — Progress Notes (Signed)
Patient has a double port. Double port accessed and flushed. Both ports flushed without difficulty. No blood return noted from either port, no bruising, pain, discomfort, or swelling noted at site. Power port left accessed in left port due to patient having CT scan this afternoon 07/10/23. CT scan staff made aware. VSS with discharge and pt left in satisfactory condition with no s/s of distress noted. All follow ups as scheduled.  Shemeca Lukasik [employer]
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
## 2023-07-16 ENCOUNTER — Inpatient Hospital Stay (HOSPITAL_BASED_OUTPATIENT_CLINIC_OR_DEPARTMENT_OTHER): Payer: 59 | Admitting: Hematology

## 2023-07-16 VITALS — BP 145/74 | HR 98 | Temp 98.5°F | Resp 20 | Wt 352.3 lb

## 2023-07-16 DIAGNOSIS — C187 Malignant neoplasm of sigmoid colon: Secondary | ICD-10-CM | POA: Diagnosis not present

## 2023-07-16 NOTE — Patient Instructions (Signed)
 [facility] at [facility] Discharge Instructions   You were seen and examined today by Dr. Ellin Saba.  He reviewed the results of your lab work which are mostly normal/stable.   He reviewed the results of your CT scan which is stable.   We will see you back in 3 months. We will repeat lab work prior to this visit.    Return as scheduled.    Thank you for choosing [facility] at [facility] to provide your oncology and hematology care.  To afford each patient quality time with our provider, please arrive at least 15 minutes before your scheduled appointment time.   If you have a lab appointment with the Cancer Center please come in thru the Main Entrance and check in at the main information desk.  You need to re-schedule your appointment should you arrive 10 or more minutes late.  We strive to give you quality time with our providers, and arriving late affects you and other patients whose appointments are after yours.  Also, if you no show three or more times for appointments you may be dismissed from the clinic at the providers discretion.     Again, thank you for choosing [facility].  Our hope is that these requests will decrease the amount of time that you wait before being seen by our physicians.       _____________________________________________________________  Should you have questions after your visit to [facility], please contact our office at [phone] and follow the prompts.  Our office hours are 8:00 a.m. and 4:30 p.m. Monday - Friday.  Please note that voicemails left after 4:00 p.m. may not be returned until the following business day.  We are closed weekends and major holidays.  You do have access to a nurse 24-7, just call the main number to the clinic [phone] and do not press any options, hold on the line and a nurse will answer the phone.    For prescription refill requests, have your  pharmacy contact our office and allow 72 hours.    Due to Covid, you will need to wear a mask upon entering the hospital. If you do not have a mask, a mask will be given to you at the Main Entrance upon arrival. For doctor visits, patients may have 1 support person age 45 or older with them. For treatment visits, patients can not have anyone with them due to social distancing guidelines and our immunocompromised population.

## 2023-07-27 ENCOUNTER — Other Ambulatory Visit: Payer: Self-pay | Admitting: *Deleted

## 2023-07-27 MED ORDER — OXYCODONE-ACETAMINOPHEN 7.5-325 MG PO TABS: 1.0000 | ORAL_TABLET | Freq: Three times a day (TID) | ORAL | 0 refills | Status: DC | PRN

## 2023-08-25 ENCOUNTER — Other Ambulatory Visit: Payer: Self-pay | Admitting: *Deleted

## 2023-08-25 MED ORDER — OXYCODONE-ACETAMINOPHEN 7.5-325 MG PO TABS: 1.0000 | ORAL_TABLET | Freq: Three times a day (TID) | ORAL | 0 refills | Status: DC | PRN

## 2023-09-25 ENCOUNTER — Other Ambulatory Visit: Payer: Self-pay

## 2023-09-25 ENCOUNTER — Telehealth: Payer: Self-pay

## 2023-09-25 ENCOUNTER — Ambulatory Visit: Admitting: Podiatrist

## 2023-09-25 MED ORDER — OXYCODONE-ACETAMINOPHEN 7.5-325 MG PO TABS: 1.0000 | ORAL_TABLET | Freq: Three times a day (TID) | ORAL | 0 refills | Status: DC | PRN

## 2023-09-25 NOTE — Telephone Encounter (Signed)
 Notified Patient of prior authorization approval for Oxycodone -Acetaminophen  7.5/325 mg Tablets. Medication is approved through 03/23/2024. Pharmacy notified. No other needs or concerns voiced at this time.

## 2023-10-16 ENCOUNTER — Inpatient Hospital Stay: Payer: 59 | Attending: Hematology

## 2023-10-16 DIAGNOSIS — Z452 Encounter for adjustment and management of vascular access device: Secondary | ICD-10-CM | POA: Insufficient documentation

## 2023-10-16 DIAGNOSIS — C187 Malignant neoplasm of sigmoid colon: Secondary | ICD-10-CM | POA: Insufficient documentation

## 2023-10-16 LAB — COMPREHENSIVE METABOLIC PANEL WITH GFR
ALT: 21 U/L (ref 0–44)
AST: 24 U/L (ref 15–41)
Albumin: 3.1 g/dL — ABNORMAL LOW (ref 3.5–5.0)
Alkaline Phosphatase: 85 U/L (ref 38–126)
Anion gap: 11 (ref 5–15)
BUN: 10 mg/dL (ref 6–20)
CO2: 28 mmol/L (ref 22–32)
Calcium: 8.5 mg/dL — ABNORMAL LOW (ref 8.9–10.3)
Chloride: 91 mmol/L — ABNORMAL LOW (ref 98–111)
Creatinine, Ser: 0.56 mg/dL — ABNORMAL LOW (ref 0.61–1.24)
GFR, Estimated: >60 mL/min (ref >60)
Glucose, Bld: 288 mg/dL — ABNORMAL HIGH (ref 70–99)
Potassium: 4.1 mmol/L (ref 3.5–5.1)
Sodium: 130 mmol/L — ABNORMAL LOW (ref 135–145)
Total Bilirubin: 0.7 mg/dL (ref 0.0–1.2)
Total Protein: 6.6 g/dL (ref 6.5–8.1)

## 2023-10-16 LAB — CBC WITH DIFFERENTIAL/PLATELET
Abs Immature Granulocytes: 0.06 10*3/uL (ref 0.00–0.07)
Basophils Absolute: 0 10*3/uL (ref 0.0–0.1)
Basophils Relative: 0 %
Eosinophils Absolute: 0.2 10*3/uL (ref 0.0–0.5)
Eosinophils Relative: 2 %
HCT: 40.5 % (ref 39.0–52.0)
Hemoglobin: 12.9 g/dL — ABNORMAL LOW (ref 13.0–17.0)
Immature Granulocytes: 1 %
Lymphocytes Relative: 19 %
Lymphs Abs: 2.1 10*3/uL (ref 0.7–4.0)
MCH: 26.3 pg (ref 26.0–34.0)
MCHC: 31.9 g/dL (ref 30.0–36.0)
MCV: 82.7 fL (ref 80.0–100.0)
Monocytes Absolute: 0.7 10*3/uL (ref 0.1–1.0)
Monocytes Relative: 6 %
Neutro Abs: 8.4 10*3/uL — ABNORMAL HIGH (ref 1.7–7.7)
Neutrophils Relative %: 72 %
Platelets: 252 10*3/uL (ref 150–400)
RBC: 4.9 MIL/uL (ref 4.22–5.81)
RDW: 16.3 % — ABNORMAL HIGH (ref 11.5–15.5)
WBC: 11.4 10*3/uL — ABNORMAL HIGH (ref 4.0–10.5)
nRBC: 0 % (ref 0.0–0.2)

## 2023-10-16 MED ORDER — HEPARIN SOD (PORK) LOCK FLUSH 100 UNIT/ML IV SOLN
500.0000 [IU] | Freq: Once | INTRAVENOUS | Status: AC
  Administered 2023-10-16: 500 [IU] via INTRAVENOUS

## 2023-10-16 MED ORDER — SODIUM CHLORIDE 0.9% FLUSH
10.0000 mL | INTRAVENOUS | Status: DC | PRN
  Administered 2023-10-16: 10 mL via INTRAVENOUS

## 2023-10-16 NOTE — Patient Instructions (Signed)
 [facility] [place] - A DEPT OF [place]  Discharge Instructions: Thank you for choosing [facility] to provide your oncology and hematology care.  If you have a lab appointment with the Cancer Center - please note that after April 8th, 2024, all labs will be drawn in the cancer center.  You do not have to check in or register with the main entrance as you have in the past but will complete your check-in in the cancer center.  Wear comfortable clothing and clothing appropriate for easy access to any Portacath or PICC line.   We strive to give you quality time with your provider. You may need to reschedule your appointment if you arrive late (15 or more minutes).  Arriving late affects you and other patients whose appointments are after yours.  Also, if you miss three or more appointments without notifying the office, you may be dismissed from the clinic at the provider's discretion.      For prescription refill requests, have your pharmacy contact our office and allow 72 hours for refills to be completed.    Today you received your double port flush today. Labs were drawn peripherally   To help prevent nausea and vomiting after your treatment, we encourage you to take your nausea medication as directed.  BELOW ARE SYMPTOMS THAT SHOULD BE REPORTED IMMEDIATELY: *FEVER GREATER THAN 100.4 F (38 C) OR HIGHER *CHILLS OR SWEATING *NAUSEA AND VOMITING THAT IS NOT CONTROLLED WITH YOUR NAUSEA MEDICATION *UNUSUAL SHORTNESS OF BREATH *UNUSUAL BRUISING OR BLEEDING *URINARY PROBLEMS (pain or burning when urinating, or frequent urination) *BOWEL PROBLEMS (unusual diarrhea, constipation, pain near the anus) TENDERNESS IN MOUTH AND THROAT WITH OR WITHOUT PRESENCE OF ULCERS (sore throat, sores in mouth, or a toothache) UNUSUAL RASH, SWELLING OR PAIN  UNUSUAL VAGINAL DISCHARGE OR ITCHING   Items with * indicate a potential emergency and should be followed up as soon  as possible or go to the Emergency Department if any problems should occur.  Please show the CHEMOTHERAPY ALERT CARD or IMMUNOTHERAPY ALERT CARD at check-in to the Emergency Department and triage nurse.  Should you have questions after your visit or need to cancel or reschedule your appointment, please contact [facility] [place] - A DEPT OF [facility] [phone]  and follow the prompts.  Office hours are 8:00 a.m. to 4:30 p.m. Monday - Friday. Please note that voicemails left after 4:00 p.m. may not be returned until the following business day.  We are closed weekends and major holidays. You have access to a nurse at all times for urgent questions. Please call the main number to the clinic [phone] and follow the prompts.  For any non-urgent questions, you may also contact your provider using MyChart. We now offer e-Visits for anyone 42 and older to request care online for non-urgent symptoms. For details visit [email or web address].   Also download the MyChart app! Go to the app store, search "MyChart", open the app, select [place], and log in with your MyChart username and password.

## 2023-10-16 NOTE — Progress Notes (Signed)
 Jose Melendez presented for Portacath access and flush. Proper placement of portacath confirmed by CXR. Portacath located right chest wall accessed with  H 20 needle. No blood return and not resistance met Portacath flushed with 20ml NS and 500U/28ml Heparin  and needle removed intact. Procedure without incident. Patient tolerated procedure well.  Jose Melendez presented for Portacath access and flush. Proper placement of portacath confirmed by CXR. Portacath located irght chest wall accessed with  H 20 needle. No blood return and no resistance met Portacath flushed with 20ml NS and 500U/17ml Heparin  and needle removed intact. Procedure without incident. Patient tolerated procedure well.  Patient has double port that does not give blood. Lab collected blood today peripherally

## 2023-10-17 LAB — CEA: CEA: 6 ng/mL — ABNORMAL HIGH (ref 0.0–4.7)

## 2023-10-22 ENCOUNTER — Inpatient Hospital Stay: Payer: 59 | Attending: Hematology | Admitting: Hematology

## 2023-10-22 VITALS — BP 152/93 | HR 89 | Temp 98.2°F | Resp 16 | Wt 347.9 lb

## 2023-10-22 DIAGNOSIS — Z79899 Other long term (current) drug therapy: Secondary | ICD-10-CM | POA: Diagnosis not present

## 2023-10-22 DIAGNOSIS — G620 Drug-induced polyneuropathy: Secondary | ICD-10-CM | POA: Diagnosis not present

## 2023-10-22 DIAGNOSIS — C187 Malignant neoplasm of sigmoid colon: Secondary | ICD-10-CM | POA: Diagnosis not present

## 2023-10-22 DIAGNOSIS — T451X5A Adverse effect of antineoplastic and immunosuppressive drugs, initial encounter: Secondary | ICD-10-CM | POA: Diagnosis not present
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
## 2023-10-22 NOTE — Patient Instructions (Signed)
[facility]  Discharge Instructions  You were seen and examined today by Dr. Ellin Saba.  Dr. Ellin Saba discussed your most recent lab work and CT scan which revealed that everything looks good and stable.  Follow-up as scheduled.    Thank you for choosing [facility] to provide your oncology and hematology care.   To afford each patient quality time with our provider, please arrive at least 15 minutes before your scheduled appointment time. You may need to reschedule your appointment if you arrive late (10 or more minutes). Arriving late affects you and other patients whose appointments are after yours.  Also, if you miss three or more appointments without notifying the office, you may be dismissed from the clinic at the provider's discretion.    Again, thank you for choosing [facility].  Our hope is that these requests will decrease the amount of time that you wait before being seen by our physicians.   If you have a lab appointment with the Cancer Center - please note that after April 8th, all labs will be drawn in the cancer center.  You do not have to check in or register with the main entrance as you have in the past but will complete your check-in at the cancer center.            _____________________________________________________________  Should you have questions after your visit to [facility], please contact our office at [phone] and follow the prompts.  Our office hours are 8:00 a.m. to 4:30 p.m. Monday - Thursday and 8:00 a.m. to 2:30 p.m. Friday.  Please note that voicemails left after 4:00 p.m. may not be returned until the following business day.  We are closed weekends and all major holidays.  You do have access to a nurse 24-7, just call the main number to the clinic [phone] and do not press any options, hold on the line and a nurse will answer the phone.    For prescription  refill requests, have your pharmacy contact our office and allow 72 hours.    Masks are no longer required in the cancer centers. If you would like for your care team to wear a mask while they are taking care of you, please let them know. You may have one support person who is at least 51 years old accompany you for your appointments.

## 2023-10-26 ENCOUNTER — Other Ambulatory Visit: Payer: Self-pay | Admitting: *Deleted

## 2023-10-26 MED ORDER — OXYCODONE-ACETAMINOPHEN 7.5-325 MG PO TABS: 1.0000 | ORAL_TABLET | Freq: Three times a day (TID) | ORAL | 0 refills | Status: DC | PRN

## 2023-11-23 ENCOUNTER — Other Ambulatory Visit: Payer: Self-pay | Admitting: *Deleted

## 2023-11-23 MED ORDER — OXYCODONE-ACETAMINOPHEN 7.5-325 MG PO TABS
1.0000 | ORAL_TABLET | Freq: Three times a day (TID) | ORAL | 0 refills | Status: DC | PRN
Start: 2023-11-23 — End: 2023-12-21

## 2023-12-21 ENCOUNTER — Other Ambulatory Visit: Payer: Self-pay | Admitting: *Deleted

## 2023-12-21 MED ORDER — OXYCODONE-ACETAMINOPHEN 7.5-325 MG PO TABS: 1.0000 | ORAL_TABLET | Freq: Three times a day (TID) | ORAL | 0 refills | Status: DC | PRN

## 2024-01-20 ENCOUNTER — Other Ambulatory Visit: Payer: Self-pay | Admitting: *Deleted

## 2024-01-20 MED ORDER — OXYCODONE-ACETAMINOPHEN 7.5-325 MG PO TABS: 1.0000 | ORAL_TABLET | Freq: Three times a day (TID) | ORAL | 0 refills | Status: DC | PRN

## 2024-01-27 ENCOUNTER — Inpatient Hospital Stay: Attending: Hematology

## 2024-01-27 ENCOUNTER — Other Ambulatory Visit

## 2024-01-27 DIAGNOSIS — Z79899 Other long term (current) drug therapy: Secondary | ICD-10-CM | POA: Insufficient documentation

## 2024-01-27 DIAGNOSIS — C187 Malignant neoplasm of sigmoid colon: Secondary | ICD-10-CM

## 2024-01-27 DIAGNOSIS — D649 Anemia, unspecified: Secondary | ICD-10-CM | POA: Insufficient documentation

## 2024-01-27 DIAGNOSIS — Z85038 Personal history of other malignant neoplasm of large intestine: Secondary | ICD-10-CM | POA: Insufficient documentation

## 2024-01-27 LAB — COMPREHENSIVE METABOLIC PANEL WITH GFR
ALT: 19 U/L (ref 0–44)
AST: 21 U/L (ref 15–41)
Albumin: 3 g/dL — ABNORMAL LOW (ref 3.5–5.0)
Alkaline Phosphatase: 89 U/L (ref 38–126)
Anion gap: 9 (ref 5–15)
BUN: 9 mg/dL (ref 6–20)
CO2: 28 mmol/L (ref 22–32)
Calcium: 8.5 mg/dL — ABNORMAL LOW (ref 8.9–10.3)
Chloride: 96 mmol/L — ABNORMAL LOW (ref 98–111)
Creatinine, Ser: 0.61 mg/dL (ref 0.61–1.24)
GFR, Estimated: >60 mL/min (ref >60)
Glucose, Bld: 326 mg/dL — ABNORMAL HIGH (ref 70–99)
Potassium: 4 mmol/L (ref 3.5–5.1)
Sodium: 133 mmol/L — ABNORMAL LOW (ref 135–145)
Total Bilirubin: 0.5 mg/dL (ref 0.0–1.2)
Total Protein: 6.5 g/dL (ref 6.5–8.1)

## 2024-01-27 LAB — CBC WITH DIFFERENTIAL/PLATELET
Abs Immature Granulocytes: 0.05 K/uL (ref 0.00–0.07)
Basophils Absolute: 0 K/uL (ref 0.0–0.1)
Basophils Relative: 0 %
Eosinophils Absolute: 0.2 K/uL (ref 0.0–0.5)
Eosinophils Relative: 2 %
HCT: 39.9 % (ref 39.0–52.0)
Hemoglobin: 12.7 g/dL — ABNORMAL LOW (ref 13.0–17.0)
Immature Granulocytes: 1 %
Lymphocytes Relative: 25 %
Lymphs Abs: 2.5 K/uL (ref 0.7–4.0)
MCH: 26.5 pg (ref 26.0–34.0)
MCHC: 31.8 g/dL (ref 30.0–36.0)
MCV: 83.3 fL (ref 80.0–100.0)
Monocytes Absolute: 0.6 K/uL (ref 0.1–1.0)
Monocytes Relative: 6 %
Neutro Abs: 6.7 K/uL (ref 1.7–7.7)
Neutrophils Relative %: 66 %
Platelets: 249 K/uL (ref 150–400)
RBC: 4.79 MIL/uL (ref 4.22–5.81)
RDW: 16.1 % — ABNORMAL HIGH (ref 11.5–15.5)
WBC: 10 K/uL (ref 4.0–10.5)
nRBC: 0 % (ref 0.0–0.2)

## 2024-01-27 NOTE — Patient Instructions (Signed)
 [facility] - A DEPT OF [facility]  Discharge Instructions: Thank you for choosing [facility] to provide your oncology and hematology care.  If you have a lab appointment with the Cancer Center - please note that after April 8th, 2024, all labs will be drawn in the cancer center.  You do not have to check in or register with the main entrance as you have in the past but will complete your check-in in the cancer center.  Wear comfortable clothing and clothing appropriate for easy access to any Portacath or PICC line.   We strive to give you quality time with your provider. You may need to reschedule your appointment if you arrive late (15 or more minutes).  Arriving late affects you and other patients whose appointments are after yours.  Also, if you miss three or more appointments without notifying the office, you may be dismissed from the clinic at the provider's discretion.      For prescription refill requests, have your pharmacy contact our office and allow 72 hours for refills to be completed.    Today you received the following port flushed and labs drawn peripherally   To help prevent nausea and vomiting after your treatment, we encourage you to take your nausea medication as directed.  BELOW ARE SYMPTOMS THAT SHOULD BE REPORTED IMMEDIATELY: *FEVER GREATER THAN 100.4 F (38 C) OR HIGHER *CHILLS OR SWEATING *NAUSEA AND VOMITING THAT IS NOT CONTROLLED WITH YOUR NAUSEA MEDICATION *UNUSUAL SHORTNESS OF BREATH *UNUSUAL BRUISING OR BLEEDING *URINARY PROBLEMS (pain or burning when urinating, or frequent urination) *BOWEL PROBLEMS (unusual diarrhea, constipation, pain near the anus) TENDERNESS IN MOUTH AND THROAT WITH OR WITHOUT PRESENCE OF ULCERS (sore throat, sores in mouth, or a toothache) UNUSUAL RASH, SWELLING OR PAIN  UNUSUAL VAGINAL DISCHARGE OR ITCHING   Items with * indicate a potential emergency and should be followed up as soon as  possible or go to the Emergency Department if any problems should occur.  Please show the CHEMOTHERAPY ALERT CARD or IMMUNOTHERAPY ALERT CARD at check-in to the Emergency Department and triage nurse.  Should you have questions after your visit or need to cancel or reschedule your appointment, please contact [facility] [place] - A DEPT OF [facility] [phone]  and follow the prompts.  Office hours are 8:00 a.m. to 4:30 p.m. Monday - Friday. Please note that voicemails left after 4:00 p.m. may not be returned until the following business day.  We are closed weekends and major holidays. You have access to a nurse at all times for urgent questions. Please call the main number to the clinic [phone] and follow the prompts.  For any non-urgent questions, you may also contact your provider using MyChart. We now offer e-Visits for anyone 51 and older to request care online for non-urgent symptoms. For details visit [email or web address].   Also download the MyChart app! Go to the app store, search MyChart, open the app, select [place], and log in with your MyChart username and password.

## 2024-01-27 NOTE — Progress Notes (Signed)
 Alm Jose Melendez presented for Portacath access and flush. Proper placement of portacath confirmed by CXR. Portacath located right chest wall accessed with  H 20 needle. No blood return and no resistance met Portacath flushed with 20ml NS and needle removed intact. Procedure without incident. Patient tolerated procedure well.

## 2024-01-28 LAB — CEA: CEA: 6.7 ng/mL — ABNORMAL HIGH (ref 0.0–4.7)
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
## 2024-02-03 ENCOUNTER — Inpatient Hospital Stay: Admitting: Physician Assistant

## 2024-02-03 ENCOUNTER — Telehealth: Payer: Self-pay | Admitting: *Deleted

## 2024-02-03 ENCOUNTER — Encounter: Payer: Self-pay | Admitting: *Deleted

## 2024-02-03 VITALS — BP 160/101 | HR 91 | Temp 98.1°F | Resp 18 | Wt 339.7 lb

## 2024-02-03 DIAGNOSIS — G620 Drug-induced polyneuropathy: Secondary | ICD-10-CM

## 2024-02-03 DIAGNOSIS — Z87891 Personal history of nicotine dependence: Secondary | ICD-10-CM

## 2024-02-03 DIAGNOSIS — C187 Malignant neoplasm of sigmoid colon: Secondary | ICD-10-CM | POA: Diagnosis not present

## 2024-02-03 DIAGNOSIS — T451X5A Adverse effect of antineoplastic and immunosuppressive drugs, initial encounter: Secondary | ICD-10-CM

## 2024-02-03 DIAGNOSIS — Z939 Artificial opening status, unspecified: Secondary | ICD-10-CM

## 2024-02-03 DIAGNOSIS — Z85038 Personal history of other malignant neoplasm of large intestine: Secondary | ICD-10-CM | POA: Diagnosis not present

## 2024-02-03 MED ORDER — GABAPENTIN 300 MG PO CAPS: 300.0000 mg | ORAL_CAPSULE | Freq: Every day | ORAL | 0 refills | Status: DC

## 2024-02-03 NOTE — Telephone Encounter (Signed)
 Castaneda Mayorga, Daniel, MD  Lamon Pleasant HERO, PA-C; Carlan, Mitzie CROME, NP; Jeanell Graeme RAMAN, CMA; Gaylene Madelin CROME, LPN; [place], [place], [place] Sure thing! Thanks  Hi Autumn/Verle Brillhart/Tammy, can you please schedule a colonoscopy? Dx: history of colon cancer and polyps. Room: 3  Thanks,  Toribio Fortune, MD Gastroenterology and Hepatology [facility] Gastroenterology

## 2024-02-03 NOTE — Progress Notes (Signed)
Noted. Will call patient to schedule.

## 2024-02-03 NOTE — Patient Instructions (Signed)
 [facility] at [facility] **VISIT SUMMARY & IMPORTANT INSTRUCTIONS **   You were seen today by Pleasant Barefoot PA-C for your history of colon cancer.    HISTORY OF COLON CANCER You do not have any evidence of returning colon cancer at this time. You are overdue for colonoscopy.  Please call your gastroenterology office [phone] to schedule this as soon as possible.  NEUROPATHY Continue taking oxycodone /acetaminophen  7.5 mg / 325 mg every 8 hours as needed for neuropathic pain. I will refill your gabapentin  prescription for you to take 300 mg every night.  TOBACCO USE It is extremely important that you quit smoking for your overall health, and also so that you can qualify for colostomy reversal surgery. We will refer you to smoking cessation counselor.  FOLLOW-UP APPOINTMENT: Labs and office visit in 3 months   ** Thank you for trusting me with your healthcare!  I strive to provide all of my patients with quality care at each visit.  If you receive a survey for this visit, I would be so grateful to you for taking the time to provide feedback.  Thank you in advance!  ~ Vashon Riordan                                        Dr. Mickiel Davonna Pleasant Barefoot, PA-C          Delon Hope, NP   - - - - - - - - - - - - - - - - - -    Thank you for choosing [facility] at [facility] to provide your oncology and hematology care.  To afford each patient quality time with our provider, please arrive at least 15 minutes before your scheduled appointment time.   If you have a lab appointment with the Cancer Center please come in thru the Main Entrance and check in at the main information desk.  You need to re-schedule your appointment should you arrive 10 or more minutes late.  We strive to give you quality time with our providers, and arriving late affects you and other patients whose appointments are after yours.  Also, if you no show three  or more times for appointments you may be dismissed from the clinic at the providers discretion.     Again, thank you for choosing [facility].  Our hope is that these requests will decrease the amount of time that you wait before being seen by our physicians.       _____________________________________________________________  Should you have questions after your visit to [facility], please contact our office at [phone] and follow the prompts.  Our office hours are 8:00 a.m. and 4:30 p.m. Monday - Friday.  Please note that voicemails left after 4:00 p.m. may not be returned until the following business day.  We are closed weekends and major holidays.  You do have access to a nurse 24-7, just call the main number to the clinic [phone] and do not press any options, hold on the line and a nurse will answer the phone.    For prescription refill requests, have your pharmacy contact our office and allow 72 hours.

## 2024-02-04 MED ORDER — PEG 3350-KCL-NA BICARB-NACL 420 G PO SOLR
4000.0000 mL | Freq: Once | ORAL | 0 refills | Status: AC
Start: 2024-02-04 — End: 2024-02-04

## 2024-02-04 NOTE — Telephone Encounter (Signed)
 Spoke with pt. He has been scheduled for 10/14. Aware will mail instructions and call back with pre-op appointment. Rx for prep to be sent to his pharmacy.

## 2024-02-04 NOTE — Addendum Note (Signed)
 Addended by: JEANELL GRAEME RAMAN on: 02/04/2024 08:31 AM   Modules accepted: Orders

## 2024-02-18 ENCOUNTER — Other Ambulatory Visit: Payer: Self-pay | Admitting: *Deleted

## 2024-02-18 MED ORDER — OXYCODONE-ACETAMINOPHEN 7.5-325 MG PO TABS: 1.0000 | ORAL_TABLET | Freq: Three times a day (TID) | ORAL | 0 refills | Status: DC | PRN

## 2024-02-25 ENCOUNTER — Encounter (HOSPITAL_COMMUNITY)
Admission: RE | Admit: 2024-02-25 | Discharge: 2024-02-25 | Disposition: A | Discharge status: Home / Self Care | Source: Ambulatory Visit | Attending: Gastroenterology | Admitting: Gastroenterology

## 2024-02-25 ENCOUNTER — Other Ambulatory Visit: Payer: Self-pay

## 2024-02-25 ENCOUNTER — Encounter (HOSPITAL_COMMUNITY): Payer: Self-pay

## 2024-02-25 HISTORY — DX: Type 2 diabetes mellitus without complications: E11.9

## 2024-02-25 HISTORY — DX: Polyneuropathy, unspecified: G62.9

## 2024-02-25 NOTE — Pre-Procedure Instructions (Signed)
 Attempted pre-op phone call. Left VM for him to call us back.

## 2024-02-25 NOTE — Progress Notes (Signed)
   02/25/24 0923  OBSTRUCTIVE SLEEP APNEA  Have you ever been diagnosed with sleep apnea through a sleep study? No  Do you snore loudly (loud enough to be heard through closed doors)?  0  Do you often feel tired, fatigued, or sleepy during the daytime (such as falling asleep during driving or talking to someone)? 0  Has anyone observed you stop breathing during your sleep? 0  Do you have, or are you being treated for high blood pressure? 0  BMI more than 35 kg/m2? 1  Age > 50 (1-yes) 1  Neck circumference greater than:Male 16 inches or larger, Male 17inches or larger? 1  Male Gender (Yes=1) 1  Obstructive Sleep Apnea Score 4  Score 5 or greater  Results sent to PCP

## 2024-03-01 ENCOUNTER — Ambulatory Visit (HOSPITAL_COMMUNITY): Admitting: Anesthesiology

## 2024-03-01 ENCOUNTER — Other Ambulatory Visit: Payer: Self-pay

## 2024-03-01 ENCOUNTER — Encounter (HOSPITAL_COMMUNITY)
Admission: RE | Disposition: A | Discharge status: Home / Self Care | Payer: Self-pay | Source: Home / Self Care | Attending: Gastroenterology

## 2024-03-01 ENCOUNTER — Ambulatory Visit (HOSPITAL_COMMUNITY)
Admission: RE | Admit: 2024-03-01 | Discharge: 2024-03-01 | Disposition: A | Discharge status: Home / Self Care | Attending: Gastroenterology | Admitting: Gastroenterology

## 2024-03-01 ENCOUNTER — Encounter (HOSPITAL_COMMUNITY): Payer: Self-pay | Admitting: Gastroenterology

## 2024-03-01 DIAGNOSIS — E119 Type 2 diabetes mellitus without complications: Secondary | ICD-10-CM | POA: Diagnosis not present

## 2024-03-01 DIAGNOSIS — Z6841 Body Mass Index (BMI) 40.0 and over, adult: Secondary | ICD-10-CM | POA: Insufficient documentation

## 2024-03-01 DIAGNOSIS — Z7984 Long term (current) use of oral hypoglycemic drugs: Secondary | ICD-10-CM | POA: Diagnosis not present

## 2024-03-01 DIAGNOSIS — D122 Benign neoplasm of ascending colon: Secondary | ICD-10-CM

## 2024-03-01 DIAGNOSIS — Z794 Long term (current) use of insulin: Secondary | ICD-10-CM | POA: Diagnosis not present

## 2024-03-01 DIAGNOSIS — E6689 Other obesity not elsewhere classified: Secondary | ICD-10-CM | POA: Insufficient documentation

## 2024-03-01 DIAGNOSIS — Z1211 Encounter for screening for malignant neoplasm of colon: Secondary | ICD-10-CM

## 2024-03-01 DIAGNOSIS — K635 Polyp of colon: Secondary | ICD-10-CM | POA: Diagnosis not present

## 2024-03-01 DIAGNOSIS — D120 Benign neoplasm of cecum: Secondary | ICD-10-CM

## 2024-03-01 DIAGNOSIS — Z79899 Other long term (current) drug therapy: Secondary | ICD-10-CM | POA: Diagnosis not present

## 2024-03-01 DIAGNOSIS — F17210 Nicotine dependence, cigarettes, uncomplicated: Secondary | ICD-10-CM

## 2024-03-01 DIAGNOSIS — Z860101 Personal history of adenomatous and serrated colon polyps: Secondary | ICD-10-CM

## 2024-03-01 DIAGNOSIS — I10 Essential (primary) hypertension: Secondary | ICD-10-CM

## 2024-03-01 DIAGNOSIS — K435 Parastomal hernia without obstruction or  gangrene: Secondary | ICD-10-CM

## 2024-03-01 DIAGNOSIS — D123 Benign neoplasm of transverse colon: Secondary | ICD-10-CM | POA: Diagnosis not present

## 2024-03-01 DIAGNOSIS — Z85038 Personal history of other malignant neoplasm of large intestine: Secondary | ICD-10-CM

## 2024-03-01 DIAGNOSIS — Z9049 Acquired absence of other specified parts of digestive tract: Secondary | ICD-10-CM

## 2024-03-01 HISTORY — PX: COLONOSCOPY: SHX5424

## 2024-03-01 LAB — HM COLONOSCOPY

## 2024-03-01 LAB — GLUCOSE, CAPILLARY: Glucose-Capillary: 187 mg/dL — ABNORMAL HIGH (ref 70–99)

## 2024-03-01 SURGERY — COLONOSCOPY
Anesthesia: General

## 2024-03-01 MED ORDER — PROPOFOL 10 MG/ML IV BOLUS
INTRAVENOUS | Status: DC | PRN
  Administered 2024-03-01: 100 mg via INTRAVENOUS

## 2024-03-01 MED ORDER — LACTATED RINGERS IV SOLN: INTRAVENOUS | Status: DC | PRN

## 2024-03-01 MED ORDER — LIDOCAINE 2% (20 MG/ML) 5 ML SYRINGE
INTRAMUSCULAR | Status: DC | PRN
Start: 2024-03-01 — End: 2024-03-01
  Administered 2024-03-01: 100 mg via INTRAVENOUS

## 2024-03-01 MED ORDER — PROPOFOL 500 MG/50ML IV EMUL
INTRAVENOUS | Status: DC | PRN
  Administered 2024-03-01: 200 ug/kg/min via INTRAVENOUS

## 2024-03-01 NOTE — Transfer of Care (Signed)
 Immediate Anesthesia Transfer of Care Note  Patient: Jose Melendez  Procedure(s) Performed: COLONOSCOPY  Patient Location: Endoscopy Unit  Anesthesia Type:General  Level of Consciousness: awake, alert , oriented, and patient cooperative  Airway & Oxygen Therapy: Patient Spontanous Breathing  Post-op Assessment: Report given to RN, Post -op Vital signs reviewed and stable, and Patient moving all extremities X 4  Post vital signs: Reviewed and stable  Last Vitals:  Vitals Value Taken Time  BP 102/64 03/01/24 09:09  Temp 37 C 03/01/24 09:09  Pulse 96 03/01/24 09:09  Resp 15 03/01/24 09:09  SpO2 97 % 03/01/24 09:09    Last Pain:  Vitals:   03/01/24 0909  TempSrc: Oral  PainSc: 0-No pain      Patients Stated Pain Goal: 6 (03/01/24 9365)  Complications: No notable events documented.

## 2024-03-01 NOTE — Op Note (Addendum)
 [facility] Patient Name: Jose Melendez Procedure Date: 03/01/2024 7:07 AM MRN: [id number] Date of Birth: August 27, 1970 Attending MD: Toribio Fortune , , [id number] CSN: [id number] Age: 53 Admit Type: Outpatient Procedure:                Colonoscopy Indications:              High risk colon cancer surveillance: Personal                            history of colon cancer Providers:                Toribio Fortune, Harlene Lips, Crystal Page Referring MD:              Medicines:                Monitored Anesthesia Care Complications:            No immediate complications. Estimated Blood Loss:     Estimated blood loss: none. Estimated blood loss:                            none. Procedure:                Pre-Anesthesia Assessment:                           - Prior to the procedure, a History and Physical                            was performed, and patient medications, allergies                            and sensitivities were reviewed. The patient's                            tolerance of previous anesthesia was reviewed.                           - The risks and benefits of the procedure and the                            sedation options and risks were discussed with the                            patient. All questions were answered and informed                            consent was obtained.                           - ASA Grade Assessment: III - A patient with severe                            systemic disease.                           After obtaining informed consent, the colonoscope  was passed under direct vision. Throughout the                            procedure, the patient's blood pressure, pulse, and                            oxygen saturations were monitored continuously. The                            PCF-HQ190L ([id number]) Peds Colon was introduced                            through the sigmoid colostomy and advanced to the                             the cecum, identified by appendiceal orifice and                            ileocecal valve. The colonoscopy was technically                            difficult and complex due to inadequate bowel prep.                            The patient tolerated the procedure well. The                            quality of the bowel preparation was fair. The                            PCF-HQ190L ([id number]) Peds Colon was introduced                            through the anus and advanced to the the rectum for                            evaluation of the [facility] pouch. Scope In: 7:48:17 AM Scope Out: 8:53:22 AM Scope Withdrawal Time: 0 hours 52 minutes 16 seconds  Total Procedure Duration: 1 hour 5 minutes 5 seconds  Findings:      Six sessile polyps were found in the transverse colon, ascending colon       and cecum. The polyps were 3 to 8 mm in size. These polyps were removed       with a cold snare. Resection and retrieval were complete.      Two semi-sessile polyps were found in the distal transverse colon. The       polyps were 5 to 10 mm in size. Area was successfully injected with 1.8       mL Eleview for a lift polypectomy. Imaging was performed using white       light and narrow band imaging to visualize the mucosa and demarcate the       polyp site after injection for EMR purposes. This was difficult as given       the presence of air leakage through ostomy, distention  of the colonic       lumen was suboptimal. These polyps were removed with a cold snare.       Resection and retrieval were complete.      There was presence of a large parastomal hernia which made the procedure       difficult.      After withdrawing the scope from ostomy, I examined the excluded       rectosigmoid area.      Hartmann pouch was located at 20 cm.      The retroflexed view of the distal rectum and anal verge was normal and       showed no anal or rectal abnormalities. There was presence of  stool balls Impression:               - Preparation of the colon was fair.                           - Six 3 to 8 mm polyps in the transverse colon, in                            the ascending colon and in the cecum, removed with                            a cold snare. Resected and retrieved.                           - Two 5 to 10 mm polyps in the distal transverse                            colon, removed with a cold snare. Resected and                            retrieved via EMR.                           - The distal rectum and anal verge are normal on                            retroflexion view. Moderate Sedation:      Per Anesthesia Care Recommendation:           - Discharge patient to home (ambulatory).                           - Resume previous diet.                           - Await pathology results.                           - Repeat colonoscopy in 1 year because the bowel                            preparation was poor. Procedure Code(s):        --- Professional ---  54614, 59, Colonoscopy, flexible; with removal of                            tumor(s), polyp(s), or other lesion(s) by snare                            technique                           45381, Colonoscopy, flexible; with directed                            submucosal injection(s), any substance Diagnosis Code(s):        --- Professional ---                           Z85.038, Personal history of other malignant                            neoplasm of large intestine                           D12.2, Benign neoplasm of ascending colon                           D12.0, Benign neoplasm of cecum                           D12.3, Benign neoplasm of transverse colon (hepatic                            flexure or splenic flexure) CPT copyright 2022 American Medical Association. All rights reserved. The codes documented in this report are preliminary and upon coder review may  be revised to  meet current compliance requirements. Toribio Fortune, MD Toribio Fortune,  03/01/2024 9:04:16 AM This report has been signed electronically. Number of Addenda: 0
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
## 2024-03-01 NOTE — Discharge Instructions (Signed)
 You are being discharged to home.  Resume your previous diet.  We are waiting for your pathology results.  Your physician has recommended a repeat colonoscopy in one year because the bowel preparation was poor.

## 2024-03-01 NOTE — Anesthesia Preprocedure Evaluation (Signed)
 Anesthesia Evaluation  Patient identified by MRN, date of birth, ID band Patient awake    Reviewed: Allergy & Precautions, H&P , NPO status , Patient's Chart, lab work & pertinent test results, reviewed documented beta blocker date and time   Airway Mallampati: II  TM Distance: >3 FB Neck ROM: full    Dental no notable dental hx.    Pulmonary neg pulmonary ROS, Current Smoker and Patient abstained from smoking.   Pulmonary exam normal breath sounds clear to auscultation       Cardiovascular Exercise Tolerance: Good hypertension, negative cardio ROS  Rhythm:regular Rate:Normal     Neuro/Psych negative neurological ROS  negative psych ROS   GI/Hepatic negative GI ROS, Neg liver ROS,,,  Endo/Other  diabetes, Type 2  Class 4 obesity  Renal/GU negative Renal ROS  negative genitourinary   Musculoskeletal   Abdominal   Peds  Hematology negative hematology ROS (+)   Anesthesia Other Findings   Reproductive/Obstetrics negative OB ROS                              Anesthesia Physical Anesthesia Plan  ASA: 3  Anesthesia Plan: General   Post-op Pain Management:    Induction:   PONV Risk Score and Plan: Propofol  infusion  Airway Management Planned:   Additional Equipment:   Intra-op Plan:   Post-operative Plan:   Informed Consent: I have reviewed the patients History and Physical, chart, labs and discussed the procedure including the risks, benefits and alternatives for the proposed anesthesia with the patient or authorized representative who has indicated his/her understanding and acceptance.     Dental Advisory Given  Plan Discussed with: CRNA  Anesthesia Plan Comments:         Anesthesia Quick Evaluation

## 2024-03-02 ENCOUNTER — Encounter (INDEPENDENT_AMBULATORY_CARE_PROVIDER_SITE_OTHER): Payer: Self-pay | Admitting: *Deleted

## 2024-03-03 ENCOUNTER — Ambulatory Visit (INDEPENDENT_AMBULATORY_CARE_PROVIDER_SITE_OTHER): Payer: Self-pay | Admitting: Gastroenterology

## 2024-03-03 LAB — SURGICAL PATHOLOGY

## 2024-03-03 NOTE — Anesthesia Postprocedure Evaluation (Signed)
 Anesthesia Post Note  Patient: Jose Melendez  Procedure(s) Performed: COLONOSCOPY  Patient location during evaluation: Phase II Anesthesia Type: General Level of consciousness: awake Pain management: pain level controlled Vital Signs Assessment: post-procedure vital signs reviewed and stable Respiratory status: spontaneous breathing and respiratory function stable Cardiovascular status: blood pressure returned to baseline and stable Postop Assessment: no headache and no apparent nausea or vomiting Anesthetic complications: no Comments: Late entry   No notable events documented.   Last Vitals:  Vitals:   03/01/24 0906 03/01/24 0909  BP:  102/64  Pulse:  96  Resp:  15  Temp: 37 C 37 C  SpO2:  97%    Last Pain:  Vitals:   03/02/24 1413  TempSrc:   PainSc: 0-No pain                 Yvonna JINNY Bosworth

## 2024-03-04 ENCOUNTER — Encounter (HOSPITAL_COMMUNITY): Payer: Self-pay | Admitting: Gastroenterology

## 2024-03-04 NOTE — Progress Notes (Signed)
 1 yr TCS noted in recall Patient result letter mailed procedure note and pathology result faxed to PCP

## 2024-03-09 NOTE — Progress Notes (Signed)
 Hi, Even though I also did the inspection of the rectum (approached it initially through ostomy and then did the rectal endoscopic examination), it is considered a colonoscopy through stoma?

## 2024-03-15 NOTE — Progress Notes (Signed)
 2) stoma access just for part involving the descending, transverse, ascending colon anc cecum. Rest was examined through anus

## 2024-03-17 ENCOUNTER — Other Ambulatory Visit: Payer: Self-pay | Admitting: *Deleted

## 2024-03-17 MED ORDER — OXYCODONE-ACETAMINOPHEN 7.5-325 MG PO TABS: 1.0000 | ORAL_TABLET | Freq: Three times a day (TID) | ORAL | 0 refills | Status: DC | PRN

## 2024-03-23 ENCOUNTER — Encounter (INDEPENDENT_AMBULATORY_CARE_PROVIDER_SITE_OTHER): Payer: Self-pay | Admitting: Gastroenterology

## 2024-04-04 NOTE — Progress Notes (Signed)
 Hi I agree with the modification: [phone] (Colonoscopy through stoma; with removal of tumor(s), polyp(s), or other lesion(s) by snare technique)  44403 (Colonoscopy through stoma; with endoscopic mucosal resection)  45330 (Sigmoidoscopy, flexible; diagnostic, including collection of specimen(s) by brushing or washing)   Thanks

## 2024-04-13 ENCOUNTER — Telehealth: Payer: Self-pay

## 2024-04-13 ENCOUNTER — Other Ambulatory Visit: Payer: Self-pay | Admitting: *Deleted

## 2024-04-13 MED ORDER — OXYCODONE-ACETAMINOPHEN 7.5-325 MG PO TABS: 1.0000 | ORAL_TABLET | Freq: Three times a day (TID) | ORAL | 0 refills | Status: DC | PRN

## 2024-04-13 NOTE — Telephone Encounter (Signed)
 Oral Oncology Patient Advocate Encounter   Received notification that prior authorization for oxyCODONE -acetaminophen  is required.   PA submitted on 04/13/2024 Key [id number] Status is pending      Charlott Hamilton,  CPhT-Adv  she/her/hers [facility]  [facility] Pharmacy Technician Patient Advocate Specialist III [facility] Phone: [phone]  Fax: [phone] [email or web address]

## 2024-04-13 NOTE — Telephone Encounter (Signed)
 Oral Oncology Patient Advocate Encounter  Prior Authorization for oxyCODONE -acetaminophen   has been approved.    PA# [id number] Effective dates: 04/13/2024 through 10/10/2024  Patient has been notified via Text   Charlott Hamilton,  CPhT-Adv  she/her/hers [facility]  [facility] Pharmacy Technician Patient Advocate Specialist III [facility] Phone: [phone]  Fax: [phone] [email or web address]

## 2024-05-09 ENCOUNTER — Inpatient Hospital Stay: Attending: Hematology

## 2024-05-09 ENCOUNTER — Inpatient Hospital Stay

## 2024-05-09 VITALS — BP 153/82 | HR 90 | Temp 97.5°F | Resp 20

## 2024-05-09 DIAGNOSIS — Z933 Colostomy status: Secondary | ICD-10-CM | POA: Diagnosis not present

## 2024-05-09 DIAGNOSIS — Z85038 Personal history of other malignant neoplasm of large intestine: Secondary | ICD-10-CM | POA: Insufficient documentation

## 2024-05-09 DIAGNOSIS — Z95828 Presence of other vascular implants and grafts: Secondary | ICD-10-CM

## 2024-05-09 DIAGNOSIS — Z87891 Personal history of nicotine dependence: Secondary | ICD-10-CM | POA: Insufficient documentation

## 2024-05-09 DIAGNOSIS — Z452 Encounter for adjustment and management of vascular access device: Secondary | ICD-10-CM | POA: Insufficient documentation

## 2024-05-09 DIAGNOSIS — C187 Malignant neoplasm of sigmoid colon: Secondary | ICD-10-CM

## 2024-05-09 LAB — CBC WITH DIFFERENTIAL/PLATELET
Abs Immature Granulocytes: 0.05 K/uL (ref 0.00–0.07)
Basophils Absolute: 0 K/uL (ref 0.0–0.1)
Basophils Relative: 0 %
Eosinophils Absolute: 0.2 K/uL (ref 0.0–0.5)
Eosinophils Relative: 2 %
HCT: 41.1 % (ref 39.0–52.0)
Hemoglobin: 13 g/dL (ref 13.0–17.0)
Immature Granulocytes: 1 %
Lymphocytes Relative: 24 %
Lymphs Abs: 2.5 K/uL (ref 0.7–4.0)
MCH: 26 pg (ref 26.0–34.0)
MCHC: 31.6 g/dL (ref 30.0–36.0)
MCV: 82.2 fL (ref 80.0–100.0)
Monocytes Absolute: 0.5 K/uL (ref 0.1–1.0)
Monocytes Relative: 5 %
Neutro Abs: 7.2 K/uL (ref 1.7–7.7)
Neutrophils Relative %: 68 %
Platelets: 288 K/uL (ref 150–400)
RBC: 5 MIL/uL (ref 4.22–5.81)
RDW: 15.3 % (ref 11.5–15.5)
WBC: 10.5 K/uL (ref 4.0–10.5)
nRBC: 0 % (ref 0.0–0.2)

## 2024-05-09 LAB — IRON AND TIBC
Iron: 29 ug/dL — ABNORMAL LOW (ref 45–182)
Saturation Ratios: 9 % — ABNORMAL LOW (ref 17.9–39.5)
TIBC: 321 ug/dL (ref 250–450)
UIBC: 291 ug/dL

## 2024-05-09 LAB — FERRITIN: Ferritin: 127 ng/mL (ref 24–336)

## 2024-05-09 NOTE — Progress Notes (Signed)
 Patients port flushed without difficulty.  No blood return noted. Port flushes with ease. No pain, discomfort, redness, or swelling noted while flushing. Band aid applied.  VSS with discharge and left in satisfactory condition with no s/s of distress noted. All follow ups as scheduled.      [name]

## 2024-05-16 ENCOUNTER — Other Ambulatory Visit: Payer: Self-pay | Admitting: *Deleted

## 2024-05-16 MED ORDER — OXYCODONE-ACETAMINOPHEN 7.5-325 MG PO TABS: 1.0000 | ORAL_TABLET | Freq: Three times a day (TID) | ORAL | 0 refills | Status: DC | PRN

## 2024-05-17 NOTE — Progress Notes (Unsigned)
 "  VIRTUAL VISIT via TELEPHONE NOTE [facility]   I connected with Jose Melendez  on 05/18/2024 at 1:20 PM by telephone and verified that I am speaking with the correct person using two identifiers.  Location: Patient: Home Provider: [facility]   I discussed the limitations, risks, security and privacy concerns of performing an evaluation and management service by telephone and the availability of in person appointments. I also discussed with the patient that there may be a patient responsible charge related to this service. The patient expressed understanding and agreed to proceed.  REASON FOR VISIT:  Follow-up for stage IIa (pT3pN0) adenocarcinoma of sigmoid colon  PRIOR THERAPY: - Sigmoid colectomy (01/02/2021) - Adjuvant CapeOx from 02/05/2021 through 06/07/2021  CURRENT THERAPY: Surveillance  BRIEF ONCOLOGIC HISTORY:  CANCER STAGING:  Cancer Staging  Adenocarcinoma of sigmoid colon [employer]) Staging form: Colon and Rectum, AJCC 8th Edition - Clinical stage from 06/03/2022: Stage IIA (ycT3, ycN0, cM0) - Unsigned  INTERVAL HISTORY:   Jose Melendez, a 53 y.o. male, returns for routine follow-up of his stage II colon cancer.  Jose Melendez was last seen on 02/03/2024 by Pleasant Barefoot PA-C.  At today's visit, he  reports feeling fairly well.  He denies any recent hospitalizations, surgeries, or changes in his  baseline health status. He reports 50% energy and 100% appetite.   He is working to lose some weight, weight reportedly down to 330 pounds per patient. He is cutting back on tobacco, down to 5-10 cigarettes daily.  He denies any recent changes in bowel habits. No abdominal pain, nausea, or vomiting. He denies any blood in his colostomy bag. He reports good appetite. He denies any unintentional weight loss, but is working on losing weight since being placed on metformin.  He is trying to watch what he eats and increase his exercise.  Regarding his  chemotherapy-induced peripheral neuropathy,  he reports continuous pain in hands and feet (feet more than hands), described as feeling too thick since he had chemotherapy. He is taking gabapentin  300 mg QHS and oxycodone  7.5/325 every 8 hours as needed, but he ran out a week ago and requests refill.  Symptoms are well-controlled on this regimen.  He does also report ongoing fatigue, reports that his energy never really came back after surgery and chemotherapy.  He is currently not working.  He previously worked at a tax adviser, but due to colostomy bag and large parastomal hernia, he is unable to complete necessary work tasks that require bending over, lifting, twisting, and repetitive motion.  He is hoping to have colostomy reversal in the future, but surgeon is requiring complete smoking cessation and weight loss prior to moving forward with elective surgery.  ASSESSMENT & PLAN:  1.   Stage IIa (pT3pN0) adenocarcinoma of sigmoid colon - Sigmoid colectomy (01/02/2021): Invasive adenocarcinoma, moderately differentiated, tumor size 10.5 x 7.5 x 5.0 cm.  Tumor invades into pericolorectal tissue.  Negative margins.  No LVI/PNI.  0/23 lymph nodes positive.  MMR proficient. - 01/24/2021: CT DNA negative. - 01/29/2021: CT chest: Several scattered lung nodules more on the left, largest nodule 1 x 0.6 cm in the lingular segment. - Adjuvant CapeOx from 02/13/2021 through 06/07/2021 (7 cycles) - Colonoscopy (02/15/2021): No evidence of disease. - PET scan results from 04/07/2022 showing enlarging lung nodules bilaterally some of which demonstrate central cavitation, remaining highly suspicious for progressive metastatic disease.  Nodules are too small to optimally evaluated by PET/CT.  No other  evidence of metastatic disease. - CT chest with contrast (11/21/2022): Small bilateral pulmonary nodules, mildly improved compared to PET/CT - CT CAP on 07/10/2023: Stable subcentimeter lung nodules.  No evidence of  recurrence. - Posttreatment colonoscopy (07/18/2022) by Dr. Eartha: Polyps x 9, ranging from 2 mm to 10 mm in size. Pathology showed colonic tubular adenoma - Most recent colonoscopy (03/01/2024): Polyps x 8, tubular adenomas.  (Per Dr. Eartha, repeat colonoscopy in 1 year, October 2026) - He denies any change in bowel habits.  No blood in the colostomy bag. - Most recent labs (05/09/2024): Normal CBC/D.  Normal ferritin, but iron saturation low at 9%.  (Labs from September 2025 showed stable mild elevation in CEA at 6.7, and normal LFTs  - due for repeat CMP and CEA every 6 months) - He has been evaluated by general surgery ([facility]) for possible colostomy reversal, with instructions for weight management and complete smoking cessation prior to scheduling surgery. - Physical exam deferred today, as patient had to switch to phone visit due to car trouble. - PLAN:  Repeat CT CAP due in February 2026 (07/10/2024).  Repeat annually. - RTC in 3 months with repeat CBC/D, CMP, CEA, iron panel.  After this visit, we will switch to labs/office visit every 6 months if stable. - START iron tablet MWF.  Consider IV iron if no improvement at next visit. - Next colonoscopy to be done October 2026 [facility] [employer]SH[employer] SURVEILLANCE GUIDELINES: Stage II & Stage III Colon Cancer: History and physical exam: Every 3 to 6 months x 2 years Then every 6 months for total of 5 years CEA monitoring:  Every 3 to 6 months x 2 years Then every 6 months for total of 5 years CT A/P (+/- chest) every 6 to 12 months from date of surgery for total of 5 years. Colonoscopy: 1 year after surgery (except if no complete preoperative colonoscopy, then colonoscopy in 3 to 6 months). If advanced adenoma, repeat in 1 year. If no advanced adenoma, repeat in 3 years, then every 5 years. PET/CT is not indicated for routine survivorship surveillance    2.  Chemotherapy-induced peripheral neuropathy: - He has continuous pain in  the hands, feet, feet more than hands, describes it as too thick since he had chemotherapy. - He is taking gabapentin  300 mg QHS and oxycodone  7.5/325 every 8 hours as needed.  Symptoms well-controlled. - PDMP reviewed by me on 05/18/2024   - PLAN: Continue gabapentin  300 mg QHS + oxycodone  7.5/325 mg every 8 hours as needed. - Continue evaluation every 3 months for renewal of narcotic prescriptions.    3.  Tobacco use - Patient has cut back to about 0.5 PPD - PLAN: Discussed importance of smoking cessation in order to qualify for colostomy reversal. - Patient has been referred to smoking cessation counselor.  4.  OTHER ISSUES - Currently not working.  He previously worked at a tax adviser, but due to colostomy bag and large parastomal hernia, he is unable to complete necessary work tasks that require bending over, lifting, twisting, and repetitive motion.  He is hoping to have colostomy reversal in the future, but surgeon is requiring complete smoking cessation and weight loss prior to moving forward with elective surgery.  5.  Social/family history: - Lives at home with his wife.  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  He is currently not working.  He is trying to  get on disability.  He is a current active smoker, 1 pack/day for the last 33 years. - No family history of malignancies.  PLAN SUMMARY: >> CT CAP due to 07/10/2024 >> PORT FLUSH in 3 months (separate from labs, port does not give blood) >> Labs (peripheral) in 3 months = CBC/D, ferritin, iron/TIBC, CMP, CEA >> OFFICE visit in 3 months (1 week after labs)    REVIEW OF SYSTEMS:   Review of Systems  Constitutional:  Positive for malaise/fatigue. Negative for chills, diaphoresis, fever and weight loss.  Respiratory:  Negative for cough and shortness of breath.   Cardiovascular:  Negative for chest pain and palpitations.  Gastrointestinal:  Negative for abdominal pain, blood in  stool, melena, nausea and vomiting.  Neurological:  Positive for tingling. Negative for dizziness and headaches.     PHYSICAL EXAM: (per limitations of virtual telephone visit)  The patient is alert and oriented x 3, exhibiting adequate mentation, good mood, and ability to speak in full sentences and execute sound judgement.  WRAP UP:   I discussed the assessment and treatment plan with the patient. The patient was provided an opportunity to ask questions and all were answered. The patient agreed with the plan and demonstrated an understanding of the instructions.   The patient was advised to call back or seek an in-person evaluation if the symptoms worsen or if the condition fails to improve as anticipated.  I provided 22 minutes of non-face-to-face time during this encounter, including >10 minutes of medical discussion.  Pleasant CHRISTELLA Barefoot, PA-C 05/18/2024 1:42 PM  "

## 2024-05-18 ENCOUNTER — Inpatient Hospital Stay: Admitting: Physician Assistant

## 2024-05-18 DIAGNOSIS — E611 Iron deficiency: Secondary | ICD-10-CM

## 2024-05-18 DIAGNOSIS — Z5689 Other problems related to employment: Secondary | ICD-10-CM

## 2024-05-18 DIAGNOSIS — T451X5A Adverse effect of antineoplastic and immunosuppressive drugs, initial encounter: Secondary | ICD-10-CM | POA: Diagnosis not present

## 2024-05-18 DIAGNOSIS — G620 Drug-induced polyneuropathy: Secondary | ICD-10-CM | POA: Diagnosis not present

## 2024-05-18 DIAGNOSIS — Z720 Tobacco use: Secondary | ICD-10-CM | POA: Diagnosis not present

## 2024-05-18 DIAGNOSIS — C187 Malignant neoplasm of sigmoid colon: Secondary | ICD-10-CM | POA: Diagnosis not present

## 2024-05-18 MED ORDER — FERROUS SULFATE 325 (65 FE) MG PO TBEC: 325.0000 mg | DELAYED_RELEASE_TABLET | ORAL | 1 refills | Status: AC

## 2024-05-18 MED ORDER — GABAPENTIN 300 MG PO CAPS: 300.0000 mg | ORAL_CAPSULE | Freq: Every day | ORAL | 0 refills | Status: AC

## 2024-06-15 ENCOUNTER — Other Ambulatory Visit: Payer: Self-pay | Admitting: *Deleted

## 2024-06-15 MED ORDER — OXYCODONE-ACETAMINOPHEN 7.5-325 MG PO TABS: 1.0000 | ORAL_TABLET | Freq: Three times a day (TID) | ORAL | 0 refills | Status: AC | PRN

## 2024-07-11 ENCOUNTER — Ambulatory Visit (HOSPITAL_COMMUNITY)

## 2024-08-08 ENCOUNTER — Inpatient Hospital Stay

## 2024-08-09 ENCOUNTER — Ambulatory Visit: Admitting: Nurse Practitioner

## 2024-08-16 ENCOUNTER — Inpatient Hospital Stay

## 2024-08-16 ENCOUNTER — Inpatient Hospital Stay: Admitting: Physician Assistant
